# Patient Record
Sex: Female | Born: 2000 | Race: White | Hispanic: No | Smoking: Former smoker
Health system: Southern US, Community
[De-identification: ages and names within clinical notes are randomized; demographics above are authoritative.]

## PROBLEM LIST (undated history)

## (undated) DIAGNOSIS — J45909 Unspecified asthma, uncomplicated: Secondary | ICD-10-CM

## (undated) HISTORY — PX: OTHER SURGICAL HISTORY: SHX169

## (undated) HISTORY — PX: TYMPANOSTOMY TUBE PLACEMENT: SHX32

## (undated) HISTORY — PX: TONSILLECTOMY: SUR1361

---
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
## 2022-03-18 NOTE — Progress Notes (Signed)
BP 117/73 (BP Location: Right Arm, Cuff Size: Normal)   Pulse (!) 106   Temp 98.5 F (36.9 C) (Oral)   Ht 5' 0.8" (1.544 m)   Wt 187 lb 12.8 oz (85.2 kg)   SpO2 98%   BMI 35.72 kg/m    Subjective:    Patient ID: Veronica Ortega, female    DOB: March 07, 2001, 22 y.o.   MRN: [id number]  HPI: Veronica Ortega is a 22 y.o. female presenting on 03/19/2022 for comprehensive medical examination. Current medical complaints include:none  She currently lives with: Menopausal Symptoms: no   Denies HA, CP, SOB, dizziness, palpitations, visual changes, and lower extremity swelling.   Depression Screen done today and results listed below:     03/19/2022    9:34 AM 03/18/2021    9:20 AM 03/04/2021    3:48 PM 12/03/2020    3:07 PM  Depression screen PHQ 2/9  Decreased Interest 0 0 1 0  Down, Depressed, Hopeless 0 0 0 0  PHQ - 2 Score 0 0 1 0  Altered sleeping 0 0 1   Tired, decreased energy 0 0 0   Change in appetite 0 0 0   Feeling bad or failure about yourself  0 0 0   Trouble concentrating 0 0 0   Moving slowly or fidgety/restless 0 0 0   Suicidal thoughts 0 0 0   PHQ-9 Score 0 0 2   Difficult doing work/chores Not difficult at all Not difficult at all Not difficult at all     The patient does not have a history of falls. I did complete a risk assessment for falls. A plan of care for falls was documented.   Past Medical History:  Past Medical History:  Diagnosis Date   Asthma     Surgical History:  Past Surgical History:  Procedure Laterality Date   Addenoids Removed     TONSILLECTOMY     TYMPANOSTOMY TUBE PLACEMENT      Medications:  Current Outpatient Medications on File Prior to Visit  Medication Sig   albuterol (VENTOLIN HFA) 108 (90 Base) MCG/ACT inhaler Inhale 2 puffs into the lungs every 6 (six) hours as needed for wheezing or shortness of breath.   etonogestrel (NEXPLANON) 68 MG IMPL implant 1 each by Subdermal route once.   No current facility-administered  medications on file prior to visit.    Allergies:  No Known Allergies  Social History:  Social History   Socioeconomic History   Marital status: Single    Spouse name: Not on file   Number of children: Not on file   Years of education: Not on file   Highest education level: Not on file  Occupational History   Not on file  Tobacco Use   Smoking status: Never   Smokeless tobacco: Never  Vaping Use   Vaping Use: Some days  Substance and Sexual Activity   Alcohol use: No   Drug use: Never   Sexual activity: Yes    Partners: Male    Birth control/protection: Implant  Other Topics Concern   Not on file  Social History Narrative   Not on file   Social Determinants of Health   Financial Resource Strain: Not on file  Food Insecurity: Not on file  Transportation Needs: Not on file  Physical Activity: Not on file  Stress: Not on file  Social Connections: Not on file  Intimate Partner Violence: Not At Risk (12/03/2020)   Humiliation, Afraid, Rape, and Kick questionnaire  Fear of Current or Ex-Partner: No    Emotionally Abused: No    Physically Abused: No    Sexually Abused: No   Social History   Tobacco Use  Smoking Status Never  Smokeless Tobacco Never   Social History   Substance and Sexual Activity  Alcohol Use No    Family History:  Family History  Problem Relation Age of Onset   Asthma Mother    Depression Mother    Asthma Maternal Grandmother    Diabetes Maternal Grandmother    Hypertension Maternal Grandmother     Past medical history, surgical history, medications, allergies, family history and social history reviewed with patient today and changes made to appropriate areas of the chart.   Review of Systems  Eyes:  Negative for blurred vision and double vision.  Respiratory:  Negative for shortness of breath.   Cardiovascular:  Negative for chest pain, palpitations and leg swelling.  Neurological:  Negative for dizziness and headaches.   All  other ROS negative except what is listed above and in the HPI.      Objective:    BP 117/73 (BP Location: Right Arm, Cuff Size: Normal)   Pulse (!) 106   Temp 98.5 F (36.9 C) (Oral)   Ht 5' 0.8" (1.544 m)   Wt 187 lb 12.8 oz (85.2 kg)   SpO2 98%   BMI 35.72 kg/m   Wt Readings from Last 3 Encounters:  03/19/22 187 lb 12.8 oz (85.2 kg) (96 %, Z= 1.74)*  03/18/21 155 lb 9.6 oz (70.6 kg) (86 %, Z= 1.08)*  03/04/21 152 lb 3.2 oz (69 kg) (84 %, Z= 0.99)*   * Growth percentiles are based on CDC (Girls, 2-20 Years) data.    Physical Exam Vitals and nursing note reviewed.  Constitutional:      General: She is awake. She is not in acute distress.    Appearance: She is well-developed. She is not ill-appearing.  HENT:     Head: Normocephalic and atraumatic.     Right Ear: Hearing, tympanic membrane, ear canal and external ear normal. No drainage.     Left Ear: Hearing, tympanic membrane, ear canal and external ear normal. No drainage.     Nose: Nose normal.     Right Sinus: No maxillary sinus tenderness or frontal sinus tenderness.     Left Sinus: No maxillary sinus tenderness or frontal sinus tenderness.     Mouth/Throat:     Mouth: Mucous membranes are moist.     Pharynx: Oropharynx is clear. Uvula midline. No pharyngeal swelling, oropharyngeal exudate or posterior oropharyngeal erythema.  Eyes:     General: Lids are normal.        Right eye: No discharge.        Left eye: No discharge.     Extraocular Movements: Extraocular movements intact.     Conjunctiva/sclera: Conjunctivae normal.     Pupils: Pupils are equal, round, and reactive to light.     Visual Fields: Right eye visual fields normal and left eye visual fields normal.  Neck:     Thyroid: No thyromegaly.     Vascular: No carotid bruit.     Trachea: Trachea normal.  Cardiovascular:     Rate and Rhythm: Normal rate and regular rhythm.     Heart sounds: Normal heart sounds. No murmur heard.    No gallop.  Pulmonary:      Effort: Pulmonary effort is normal. No accessory muscle usage or respiratory distress.  Breath sounds: Normal breath sounds.  Chest:  Breasts:    Right: Normal.     Left: Normal.  Abdominal:     General: Bowel sounds are normal.     Palpations: Abdomen is soft. There is no hepatomegaly or splenomegaly.     Tenderness: There is no abdominal tenderness.  Musculoskeletal:        General: Normal range of motion.     Cervical back: Normal range of motion and neck supple.     Right lower leg: No edema.     Left lower leg: No edema.  Lymphadenopathy:     Head:     Right side of head: No submental, submandibular, tonsillar, preauricular or posterior auricular adenopathy.     Left side of head: No submental, submandibular, tonsillar, preauricular or posterior auricular adenopathy.     Cervical: No cervical adenopathy.     Upper Body:     Right upper body: No supraclavicular, axillary or pectoral adenopathy.     Left upper body: No supraclavicular, axillary or pectoral adenopathy.  Skin:    General: Skin is warm and dry.     Capillary Refill: Capillary refill takes less than 2 seconds.     Findings: No rash.  Neurological:     Mental Status: She is alert and oriented to person, place, and time.     Gait: Gait is intact.     Deep Tendon Reflexes: Reflexes are normal and symmetric.     Reflex Scores:      Brachioradialis reflexes are 2+ on the right side and 2+ on the left side.      Patellar reflexes are 2+ on the right side and 2+ on the left side. Psychiatric:        Attention and Perception: Attention normal.        Mood and Affect: Mood normal.        Speech: Speech normal.        Behavior: Behavior normal. Behavior is cooperative.        Thought Content: Thought content normal.        Judgment: Judgment normal.     Results for orders placed or performed in visit on 03/18/21  Chlamydia/Gonococcus/Trichomonas, NAA([facility])   Specimen: Urine   UR  Result Value Ref Range    Chlamydia by NAA Negative Negative   Gonococcus by NAA Negative Negative   Trich vag by NAA Negative Negative  Microscopic Examination   Urine  Result Value Ref Range   WBC, UA 0-5 0 - 5 /hpf   RBC, Urine None seen 0 - 2 /hpf   Epithelial Cells (non renal) 0-10 0 - 10 /hpf   Mucus, UA Present (A) Not Estab.   Bacteria, UA Many (A) None seen/Few  CBC with Differential/Platelet  Result Value Ref Range   WBC 9.7 3.4 - 10.8 x10E3/uL   RBC 5.09 3.77 - 5.28 x10E6/uL   Hemoglobin 15.1 11.1 - 15.9 g/dL   Hematocrit 46.2 34.0 - 46.6 %   MCV 91 79 - 97 fL   MCH 29.7 26.6 - 33.0 pg   MCHC 32.7 31.5 - 35.7 g/dL   RDW 12.7 11.7 - 15.4 %   Platelets 326 150 - 450 x10E3/uL   Neutrophils 66 Not Estab. %   Lymphs 24 Not Estab. %   Monocytes 7 Not Estab. %   Eos 2 Not Estab. %   Basos 1 Not Estab. %   Neutrophils Absolute 6.4 1.4 - 7.0 x10E3/uL  Lymphocytes Absolute 2.3 0.7 - 3.1 x10E3/uL   Monocytes Absolute 0.7 0.1 - 0.9 x10E3/uL   EOS (ABSOLUTE) 0.2 0.0 - 0.4 x10E3/uL   Basophils Absolute 0.1 0.0 - 0.2 x10E3/uL   Immature Granulocytes 0 Not Estab. %   Immature Grans (Abs) 0.0 0.0 - 0.1 x10E3/uL  Comprehensive metabolic panel  Result Value Ref Range   Glucose 85 70 - 99 mg/dL   BUN 15 6 - 20 mg/dL   Creatinine, Ser 0.72 0.57 - 1.00 mg/dL   eGFR 124 >59 mL/min/1.73   BUN/Creatinine Ratio 21 9 - 23   Sodium 139 134 - 144 mmol/L   Potassium 4.3 3.5 - 5.2 mmol/L   Chloride 101 96 - 106 mmol/L   CO2 20 20 - 29 mmol/L   Calcium 9.7 8.7 - 10.2 mg/dL   Total Protein 6.9 6.0 - 8.5 g/dL   Albumin 4.5 3.9 - 5.0 g/dL   Globulin, Total 2.4 1.5 - 4.5 g/dL   Albumin/Globulin Ratio 1.9 1.2 - 2.2   Bilirubin Total 0.2 0.0 - 1.2 mg/dL   Alkaline Phosphatase 87 42 - 106 IU/L   AST 13 0 - 40 IU/L   ALT 13 0 - 32 IU/L  Lipid panel  Result Value Ref Range   Cholesterol, Total 179 (H) 100 - 169 mg/dL   Triglycerides 70 0 - 89 mg/dL   HDL 41 >39 mg/dL   VLDL Cholesterol Cal 13 5 - 40 mg/dL    LDL Chol Calc (NIH) 125 (H) 0 - 109 mg/dL   Chol/HDL Ratio 4.4 0.0 - 4.4 ratio  TSH  Result Value Ref Range   TSH 2.620 0.450 - 4.500 uIU/mL  Urinalysis, Routine w reflex microscopic  Result Value Ref Range   Specific Gravity, UA >1.030 (H) 1.005 - 1.030   pH, UA 6.0 5.0 - 7.5   Color, UA Yellow Yellow   Appearance Ur Clear Clear   Leukocytes,UA Trace (A) Negative   Protein,UA Negative Negative/Trace   Glucose, UA Negative Negative   Ketones, UA Negative Negative   RBC, UA Negative Negative   Bilirubin, UA Negative Negative   Urobilinogen, Ur 0.2 0.2 - 1.0 mg/dL   Nitrite, UA Negative Negative   Microscopic Examination See below:       Assessment & Plan:   Problem List Items Addressed This Visit   None Visit Diagnoses     Annual physical exam    -  Primary   Health maintenance reviewed during visit today.  Labs ordered.  Vaccines up to date.   Relevant Orders   CBC with Differential/Platelet   Comprehensive metabolic panel   Lipid panel   TSH   Urinalysis, Routine w reflex microscopic   HIV antibody (with reflex)   Screening for ischemic heart disease       Relevant Orders   Lipid panel   Screening for chlamydial disease       Relevant Orders   GC/Chlamydia Probe Amp   Encounter for hepatitis C screening test for low risk patient       Relevant Orders   Hepatitis C Antibody   Need for influenza vaccination       Relevant Orders   Flu Vaccine QUAD 46moIM (Fluarix, Fluzone & Alfiuria Quad PF) (Completed)        Follow up plan: Return in about 1 year (around 03/20/2023) for Physical and Fasting labs.   LABORATORY TESTING:  - Pap smear: not applicable  IMMUNIZATIONS:   - Tdap: Tetanus vaccination status  reviewed: last tetanus booster within 10 years. - Influenza: Up to date - Pneumovax: Not applicable - Prevnar: Not applicable - COVID: Not applicable - HPV: Up to date - Shingrix vaccine: Not applicable  SCREENING: -Mammogram: Not applicable  -  Colonoscopy: Not applicable  - Bone Density: Not applicable  -Hearing Test: Not applicable  -Spirometry: Not applicable   PATIENT COUNSELING:   Advised to take 1 mg of folate supplement per day if capable of pregnancy.   Sexuality: Discussed sexually transmitted diseases, partner selection, use of condoms, avoidance of unintended pregnancy  and contraceptive alternatives.   Advised to avoid cigarette smoking.  I discussed with the patient that most people either abstain from alcohol or drink within safe limits (<=14/week and <=4 drinks/occasion for males, <=7/weeks and <= 3 drinks/occasion for females) and that the risk for alcohol disorders and other health effects rises proportionally with the number of drinks per week and how often a drinker exceeds daily limits.  Discussed cessation/primary prevention of drug use and availability of treatment for abuse.   Diet: Encouraged to adjust caloric intake to maintain  or achieve ideal body weight, to reduce intake of dietary saturated fat and total fat, to limit sodium intake by avoiding high sodium foods and not adding table salt, and to maintain adequate dietary potassium and calcium preferably from fresh fruits, vegetables, and low-fat dairy products.    stressed the importance of regular exercise  Injury prevention: Discussed safety belts, safety helmets, smoke detector, smoking near bedding or upholstery.   Dental health: Discussed importance of regular tooth brushing, flossing, and dental visits.    NEXT PREVENTATIVE PHYSICAL DUE IN 1 YEAR. Return in about 1 year (around 03/20/2023) for Physical and Fasting labs.

## 2022-03-19 ENCOUNTER — Ambulatory Visit (INDEPENDENT_AMBULATORY_CARE_PROVIDER_SITE_OTHER): Payer: Medicaid Other | Admitting: Nurse Practitioner

## 2022-03-19 ENCOUNTER — Encounter: Payer: Self-pay | Admitting: Nurse Practitioner

## 2022-03-19 VITALS — BP 117/73 | HR 106 | Temp 98.5°F | Ht 60.8 in | Wt 187.8 lb

## 2022-03-19 DIAGNOSIS — Z23 Encounter for immunization: Secondary | ICD-10-CM

## 2022-03-19 DIAGNOSIS — R8290 Unspecified abnormal findings in urine: Secondary | ICD-10-CM

## 2022-03-19 DIAGNOSIS — Z118 Encounter for screening for other infectious and parasitic diseases: Secondary | ICD-10-CM

## 2022-03-19 DIAGNOSIS — Z1159 Encounter for screening for other viral diseases: Secondary | ICD-10-CM | POA: Diagnosis not present

## 2022-03-19 DIAGNOSIS — Z136 Encounter for screening for cardiovascular disorders: Secondary | ICD-10-CM | POA: Diagnosis not present

## 2022-03-19 DIAGNOSIS — Z0000 Encounter for general adult medical examination without abnormal findings: Secondary | ICD-10-CM | POA: Diagnosis not present

## 2022-03-19 LAB — URINALYSIS, ROUTINE W REFLEX MICROSCOPIC
Bilirubin, UA: NEGATIVE
Glucose, UA: NEGATIVE
Ketones, UA: NEGATIVE
Nitrite, UA: NEGATIVE
RBC, UA: NEGATIVE
Specific Gravity, UA: 1.025 (ref 1.005–1.030)
Urobilinogen, Ur: 0.2 mg/dL (ref 0.2–1.0)
pH, UA: 5.5 (ref 5.0–7.5)

## 2022-03-19 LAB — MICROSCOPIC EXAMINATION: Epithelial Cells (non renal): >10 /hpf — ABNORMAL HIGH (ref 0–10)

## 2022-03-19 NOTE — Addendum Note (Signed)
Addended by: Jon Billings on: 03/19/2022 11:38 AM   Modules accepted: Orders

## 2022-03-20 LAB — HIV ANTIBODY (ROUTINE TESTING W REFLEX): HIV Screen 4th Generation wRfx: NONREACTIVE

## 2022-03-20 LAB — CBC WITH DIFFERENTIAL/PLATELET
Basophils Absolute: 0.1 10*3/uL (ref 0.0–0.2)
Basos: 1 %
EOS (ABSOLUTE): 0.1 10*3/uL (ref 0.0–0.4)
Eos: 1 %
Hematocrit: 47.3 % — ABNORMAL HIGH (ref 34.0–46.6)
Hemoglobin: 15.6 g/dL (ref 11.1–15.9)
Immature Grans (Abs): 0 10*3/uL (ref 0.0–0.1)
Immature Granulocytes: 0 %
Lymphocytes Absolute: 2.4 10*3/uL (ref 0.7–3.1)
Lymphs: 22 %
MCH: 29.5 pg (ref 26.6–33.0)
MCHC: 33 g/dL (ref 31.5–35.7)
MCV: 90 fL (ref 79–97)
Monocytes Absolute: 0.8 10*3/uL (ref 0.1–0.9)
Monocytes: 8 %
Neutrophils Absolute: 7.5 10*3/uL — ABNORMAL HIGH (ref 1.4–7.0)
Neutrophils: 68 %
Platelets: 372 10*3/uL (ref 150–450)
RBC: 5.28 x10E6/uL (ref 3.77–5.28)
RDW: 12.5 % (ref 11.7–15.4)
WBC: 11 10*3/uL — ABNORMAL HIGH (ref 3.4–10.8)

## 2022-03-20 LAB — COMPREHENSIVE METABOLIC PANEL
ALT: 11 IU/L (ref 0–32)
AST: 16 IU/L (ref 0–40)
Albumin/Globulin Ratio: 1.7 (ref 1.2–2.2)
Albumin: 4.5 g/dL (ref 4.0–5.0)
Alkaline Phosphatase: 112 IU/L — ABNORMAL HIGH (ref 42–106)
BUN/Creatinine Ratio: 15 (ref 9–23)
BUN: 11 mg/dL (ref 6–20)
Bilirubin Total: 0.6 mg/dL (ref 0.0–1.2)
CO2: 22 mmol/L (ref 20–29)
Calcium: 10 mg/dL (ref 8.7–10.2)
Chloride: 100 mmol/L (ref 96–106)
Creatinine, Ser: 0.75 mg/dL (ref 0.57–1.00)
Globulin, Total: 2.7 g/dL (ref 1.5–4.5)
Glucose: 92 mg/dL (ref 70–99)
Potassium: 3.9 mmol/L (ref 3.5–5.2)
Sodium: 139 mmol/L (ref 134–144)
Total Protein: 7.2 g/dL (ref 6.0–8.5)
eGFR: 118 mL/min/{1.73_m2} (ref >59)

## 2022-03-20 LAB — LIPID PANEL
Chol/HDL Ratio: 5.8 ratio — ABNORMAL HIGH (ref 0.0–4.4)
Cholesterol, Total: 186 mg/dL — ABNORMAL HIGH (ref 100–169)
HDL: 32 mg/dL — ABNORMAL LOW (ref >39)
LDL Chol Calc (NIH): 124 mg/dL — ABNORMAL HIGH (ref 0–109)
Triglycerides: 168 mg/dL — ABNORMAL HIGH (ref 0–89)
VLDL Cholesterol Cal: 30 mg/dL (ref 5–40)

## 2022-03-20 LAB — HEPATITIS C ANTIBODY: Hep C Virus Ab: NONREACTIVE

## 2022-03-20 LAB — TSH: TSH: 4.55 u[IU]/mL — ABNORMAL HIGH (ref 0.450–4.500)

## 2022-03-22 LAB — URINE CULTURE: Organism ID, Bacteria: NO GROWTH

## 2022-03-22 LAB — GC/CHLAMYDIA PROBE AMP
Chlamydia trachomatis, NAA: NEGATIVE
Neisseria Gonorrhoeae by PCR: NEGATIVE

## 2022-03-22 NOTE — Progress Notes (Signed)
Please let patient know that her lab work looks good.  Her cholesterol is elevated from prior.  I recommend following a low fat diet and exercising 5x weekly for at least 30 minutes.  Thyroid labs are slightly abnormal but we will recheck these at your next visit.  No other concerns at this time.  Follow up as discussed.

## 2022-03-31 ENCOUNTER — Telehealth: Payer: Self-pay

## 2022-03-31 NOTE — Telephone Encounter (Signed)
Pt given lab results per notes of K. Holdsworth NP on 03/31/22. Pt verbalized understanding.

## 2022-04-01 ENCOUNTER — Ambulatory Visit
Admission: EM | Admit: 2022-04-01 | Discharge: 2022-04-01 | Disposition: A | Discharge status: Home / Self Care | Payer: Medicaid Other | Attending: Internal Medicine | Admitting: Internal Medicine

## 2022-04-01 DIAGNOSIS — L03032 Cellulitis of left toe: Secondary | ICD-10-CM

## 2022-04-01 DIAGNOSIS — L600 Ingrowing nail: Secondary | ICD-10-CM

## 2022-04-01 MED ORDER — CEPHALEXIN 500 MG PO CAPS: 500.0000 mg | ORAL_CAPSULE | Freq: Three times a day (TID) | ORAL | 0 refills | Status: AC

## 2022-04-01 MED ORDER — IBUPROFEN 400 MG PO TABS: 400.0000 mg | ORAL_TABLET | Freq: Four times a day (QID) | ORAL | 0 refills | Status: DC | PRN

## 2022-04-01 NOTE — ED Triage Notes (Signed)
Pt c/o LT big toe swelling & pain, pt reports toe looks infected, pt states she had this occur in December in same toe.

## 2022-04-01 NOTE — Discharge Instructions (Signed)
Warm salt water soaks Tylenol/Motrin as needed for pain and/or fever Take medications as prescribed Please apply topical antibiotic ointment to the nail fold until it is healed completely. If you have worsening symptoms please return to urgent care to be reevaluated

## 2022-04-03 NOTE — ED Provider Notes (Signed)
[facility]    CSN: [id number] Arrival date & time: 04/01/22  4098      History   Chief Complaint Chief Complaint  Patient presents with   Toe Pain    LT big toe    HPI Veronica Ortega is a 22 y.o. female comes to urgent care with worsening pain in the left great toe.  Patient says she had a similar episode a few weeks ago involving the medial nail fold of the left great toe.  That subsided without any intervention.  Over the past few days she has been experiencing worsening pain, swelling, purulent discharge and redness of the lateral nail fold of the left great toe.  She denies any trauma to the left foot or toe.  No fever or chills.Marland Kitchen   HPI  Past Medical History:  Diagnosis Date   Asthma     Patient Active Problem List   Diagnosis Date Noted   Asthma    Depression, major, single episode, complete remission ([place])    Seasonal allergic rhinitis due to pollen 01/03/2015    Past Surgical History:  Procedure Laterality Date   Addenoids Removed     TONSILLECTOMY     TYMPANOSTOMY TUBE PLACEMENT      OB History     Gravida  0   Para  0   Term  0   Preterm  0   AB  0   Living  0      SAB  0   IAB  0   Ectopic  0   Multiple  0   Live Births  0            Home Medications    Prior to Admission medications   Medication Sig Start Date End Date Taking? Authorizing Provider  cephALEXin (KEFLEX) 500 MG capsule Take 1 capsule (500 mg total) by mouth 3 (three) times daily for 5 days. 04/01/22 04/06/22 Yes Jamyah Folk, Myrene Galas, MD  ibuprofen (ADVIL) 400 MG tablet Take 1 tablet (400 mg total) by mouth every 6 (six) hours as needed. 04/01/22  Yes Yolanda Huffstetler, Myrene Galas, MD  albuterol (VENTOLIN HFA) 108 (90 Base) MCG/ACT inhaler Inhale 2 puffs into the lungs every 6 (six) hours as needed for wheezing or shortness of breath.    [provider]  etonogestrel (NEXPLANON) 68 MG IMPL implant 1 each by Subdermal route once.    [provider]     Family History Family History  Problem Relation Age of Onset   Asthma Mother    Depression Mother    Asthma Maternal Grandmother    Diabetes Maternal Grandmother    Hypertension Maternal Grandmother     Social History Social History   Tobacco Use   Smoking status: Never   Smokeless tobacco: Never  Vaping Use   Vaping Use: Some days  Substance Use Topics   Alcohol use: No   Drug use: Never     Allergies   Patient has no known allergies.   Review of Systems Review of Systems  Constitutional: Negative.   Respiratory: Negative.    Genitourinary: Negative.   Musculoskeletal: Negative.   Skin:  Positive for color change and wound.     Physical Exam Triage Vital Signs ED Triage Vitals  Enc Vitals Group     BP 04/01/22 0938 123/80     Pulse Rate 04/01/22 0938 80     Resp --      Temp 04/01/22 0938 (!) 97.3 F (36.3 C)  Temp Source 04/01/22 0938 Oral     SpO2 04/01/22 0938 99 %     Weight 04/01/22 0936 187 lb 12.8 oz (85.2 kg)     Height 04/01/22 0936 5' 0.79" (1.544 m)     Head Circumference --      Peak Flow --      Pain Score 04/01/22 0936 7     Pain Loc --      Pain Edu? --      Excl. in GC? --    No data found.  Updated Vital Signs BP 123/80 (BP Location: Right Arm)   Pulse 80   Temp (!) 97.3 F (36.3 C) (Oral)   Ht 5' 0.79" (1.544 m)   Wt 85.2 kg   SpO2 99%   BMI 35.73 kg/m   Visual Acuity Right Eye Distance:   Left Eye Distance:   Bilateral Distance:    Right Eye Near:   Left Eye Near:    Bilateral Near:     Physical Exam Vitals and nursing note reviewed.  Constitutional:      Appearance: Normal appearance.  Cardiovascular:     Rate and Rhythm: Normal rate and regular rhythm.     Pulses: Normal pulses.     Heart sounds: Normal heart sounds.  Pulmonary:     Effort: Pulmonary effort is normal.     Breath sounds: Normal breath sounds.  Musculoskeletal:        General: Normal range of motion.  Skin:    Findings: Erythema  present.     Comments: Tender swelling of the lateral nail fold of the left great toe.  Swelling is erythematous.  Nail of the left great toe is intact.  Neurological:     Mental Status: She is alert.      UC Treatments / Results  Labs (all labs ordered are listed, but only abnormal results are displayed) Labs Reviewed - No data to display  EKG   Radiology No results found.  Procedures Excise Ingrown Toenail  Date/Time: 04/03/2022 11:23 PM  Performed by: Merrilee Jansky, MD Authorized by: Merrilee Jansky, MD   Consent:    Consent obtained:  Verbal   Consent given by:  Patient   Risks discussed:  Bleeding and incomplete removal   Alternatives discussed:  No treatment Universal protocol:    Patient identity confirmed:  Verbally with patient Location:    Foot:  L big toe Pre-procedure details:    Skin preparation:  Povidone-iodine   Preparation: Patient was prepped and draped in the usual sterile fashion   Anesthesia:    Anesthesia method:  Local infiltration   Local anesthetic:  Lidocaine 1% w/o epi Nail Removal:    Nail removed:  Complete   Nail bed repaired: no     Removed nail replaced and anchored: no   Trephination:    Subungual hematoma drained: no   Ingrown nail:    Wedge excision of skin: no     Nail matrix removed or ablated:  None Post-procedure details:    Dressing:  Non-adhesive packing strip and antibiotic ointment   Procedure completion:  Tolerated well, no immediate complications  (including critical care time)  Medications Ordered in UC Medications - No data to display  Initial Impression / Assessment and Plan / UC Course  I have reviewed the triage vital signs and the nursing notes.  Pertinent labs & imaging results that were available during my care of the patient were reviewed by me and considered  in my medical decision making (see chart for details).     1.  Ingrown toenail left great toe: Keflex 500 mg 3 times daily for 5  days Ibuprofen every 6-8 hours as needed for pain Warm salt water soaks Return precautions given. Final Clinical Impressions(s) / UC Diagnoses   Final diagnoses:  Paronychia of toe of left foot due to ingrown toenail     Discharge Instructions      Warm salt water soaks Tylenol/Motrin as needed for pain and/or fever Take medications as prescribed Please apply topical antibiotic ointment to the nail fold until it is healed completely. If you have worsening symptoms please return to urgent care to be reevaluated   ED Prescriptions     Medication Sig Dispense Auth. Provider   cephALEXin (KEFLEX) 500 MG capsule Take 1 capsule (500 mg total) by mouth 3 (three) times daily for 5 days. 15 capsule Nakeesha Bowler, Myrene Galas, MD   ibuprofen (ADVIL) 400 MG tablet Take 1 tablet (400 mg total) by mouth every 6 (six) hours as needed. 30 tablet Eryck Negron, Myrene Galas, MD      PDMP not reviewed this encounter.   Chase Picket, MD 04/03/22 2325

## 2022-05-14 DIAGNOSIS — H5213 Myopia, bilateral: Secondary | ICD-10-CM | POA: Diagnosis not present

## 2022-05-24 ENCOUNTER — Ambulatory Visit
Admission: EM | Admit: 2022-05-24 | Discharge: 2022-05-24 | Disposition: A | Discharge status: Home / Self Care | Payer: Medicaid Other | Attending: Physician Assistant | Admitting: Physician Assistant

## 2022-05-24 ENCOUNTER — Encounter: Payer: Self-pay | Admitting: Emergency Medicine

## 2022-05-24 DIAGNOSIS — Z3202 Encounter for pregnancy test, result negative: Secondary | ICD-10-CM

## 2022-05-24 LAB — PREGNANCY, URINE: Preg Test, Ur: NEGATIVE

## 2022-05-24 NOTE — ED Provider Notes (Signed)
[facility]    CSN: [id number] Arrival date & time: 05/24/22  1556      History   Chief Complaint Chief Complaint  Patient presents with   Possible Pregnancy    HPI Veronica Ortega is a 22 y.o. female presenting for concerns about possible pregnancy.  Patient reports that she has had Nexplanon for the past 2 years.  States she can still feel it in her arm.  Says over the past few days she started to have headaches, mood swings and breast tenderness.  She reports that her last menstrual period was in 2021 and she has not had a menstrual period since being on birth control.  She is sexually active.  No other complaints or concerns.  HPI  Past Medical History:  Diagnosis Date   Asthma     Patient Active Problem List   Diagnosis Date Noted   Asthma    Depression, major, single episode, complete remission ([place])    Seasonal allergic rhinitis due to pollen 01/03/2015    Past Surgical History:  Procedure Laterality Date   Addenoids Removed     TONSILLECTOMY     TYMPANOSTOMY TUBE PLACEMENT      OB History     Gravida  0   Para  0   Term  0   Preterm  0   AB  0   Living  0      SAB  0   IAB  0   Ectopic  0   Multiple  0   Live Births  0            Home Medications    Prior to Admission medications   Medication Sig Start Date End Date Taking? Authorizing Provider  albuterol (VENTOLIN HFA) 108 (90 Base) MCG/ACT inhaler Inhale 2 puffs into the lungs every 6 (six) hours as needed for wheezing or shortness of breath.    [provider]  etonogestrel (NEXPLANON) 68 MG IMPL implant 1 each by Subdermal route once.    [provider]  ibuprofen (ADVIL) 400 MG tablet Take 1 tablet (400 mg total) by mouth every 6 (six) hours as needed. 04/01/22   Lamptey, Myrene Galas, MD    Family History Family History  Problem Relation Age of Onset   Asthma Mother    Depression Mother    Asthma Maternal Grandmother    Diabetes Maternal  Grandmother    Hypertension Maternal Grandmother     Social History Social History   Tobacco Use   Smoking status: Every Day    Types: Cigarettes   Smokeless tobacco: Never  Vaping Use   Vaping Use: Former  Substance Use Topics   Alcohol use: No   Drug use: Never     Allergies   Patient has no known allergies.   Review of Systems Review of Systems  Constitutional:  Negative for fatigue and fever.  Respiratory:  Negative for shortness of breath.   Cardiovascular:  Negative for chest pain.  Gastrointestinal:  Negative for abdominal pain, nausea and vomiting.  Genitourinary:  Negative for vaginal bleeding.  Skin:        +breast tenderness  Neurological:  Positive for headaches. Negative for dizziness and weakness.  Psychiatric/Behavioral:         +mood swings     Physical Exam Triage Vital Signs ED Triage Vitals  Enc Vitals Group     BP 05/24/22 1645 135/85     Pulse Rate 05/24/22 1645 (!) 126  Resp 05/24/22 1645 18     Temp 05/24/22 1645 97.9 F (36.6 C)     Temp Source 05/24/22 1645 Oral     SpO2 05/24/22 1645 96 %     Weight --      Height --      Head Circumference --      Peak Flow --      Pain Score 05/24/22 1644 0     Pain Loc --      Pain Edu? --      Excl. in [place]? --    No data found.  Updated Vital Signs BP 135/85 (BP Location: Right Arm)   Pulse (!) 126   Temp 97.9 F (36.6 C) (Oral)   Resp 18   SpO2 96%      Physical Exam Vitals and nursing note reviewed.  Constitutional:      General: She is not in acute distress.    Appearance: Normal appearance. She is not ill-appearing or toxic-appearing.  HENT:     Head: Normocephalic and atraumatic.     Nose: Nose normal.     Mouth/Throat:     Mouth: Mucous membranes are moist.     Pharynx: Oropharynx is clear.  Eyes:     General: No scleral icterus.       Right eye: No discharge.        Left eye: No discharge.     Conjunctiva/sclera: Conjunctivae normal.  Cardiovascular:     Rate  and Rhythm: Regular rhythm. Tachycardia present.     Heart sounds: Normal heart sounds.  Pulmonary:     Effort: Pulmonary effort is normal. No respiratory distress.     Breath sounds: Normal breath sounds.  Musculoskeletal:     Cervical back: Neck supple.  Skin:    General: Skin is dry.  Neurological:     General: No focal deficit present.     Mental Status: She is alert. Mental status is at baseline.     Motor: No weakness.     Gait: Gait normal.  Psychiatric:        Mood and Affect: Mood normal.        Behavior: Behavior normal.        Thought Content: Thought content normal.      UC Treatments / Results  Labs (all labs ordered are listed, but only abnormal results are displayed) Labs Reviewed  PREGNANCY, URINE    EKG   Radiology No results found.  Procedures Procedures (including critical care time)  Medications Ordered in UC Medications - No data to display  Initial Impression / Assessment and Plan / UC Course  I have reviewed the triage vital signs and the nursing notes.  Pertinent labs & imaging results that were available during my care of the patient were reviewed by me and considered in my medical decision making (see chart for details).   22 year old female presents for headaches, mood swings and breast tenderness for the past few days.  Would like a pregnancy test.  Has had a Nexplanon for the past couple of years and can still feel it in her arm.  She is sexually active.  She has not had a menstrual period since before she had the Nexplanon.  No other complaints or symptoms.  Pregnancy test negative.  Discussed with patient.  Advised her that her symptoms are likely related to the birth control and hormonal symptoms.  Suggested rest, fluids, Midol or ibuprofen for discomfort.  Reviewed return precautions.  Final Clinical Impressions(s) / UC Diagnoses   Final diagnoses:  Encounter for pregnancy test with result negative   Discharge Instructions    None    ED Prescriptions   None    PDMP not reviewed this encounter.   Danton Clap, PA-C 05/24/22 1708

## 2022-05-24 NOTE — ED Triage Notes (Signed)
Pt presents for pregnancy test. She reports having a nexplanon. She is having headaches, mood swings and breast tenderness.

## 2022-08-10 ENCOUNTER — Ambulatory Visit
Admission: EM | Admit: 2022-08-10 | Discharge: 2022-08-10 | Disposition: A | Discharge status: Home / Self Care | Payer: Medicaid Other | Attending: Emergency Medicine | Admitting: Emergency Medicine

## 2022-08-10 DIAGNOSIS — N61 Mastitis without abscess: Secondary | ICD-10-CM | POA: Diagnosis not present

## 2022-08-10 MED ORDER — IBUPROFEN 600 MG PO TABS: 600.0000 mg | ORAL_TABLET | Freq: Four times a day (QID) | ORAL | 0 refills | Status: DC | PRN

## 2022-08-10 MED ORDER — DOXYCYCLINE HYCLATE 100 MG PO CAPS: 100.0000 mg | ORAL_CAPSULE | Freq: Two times a day (BID) | ORAL | 0 refills | Status: AC

## 2022-08-10 NOTE — ED Provider Notes (Signed)
HPI  SUBJECTIVE:  Veronica Ortega is a 22 y.o. female who presents with 4 days of a painful area of erythema with gradually increasing size along her lateral left breast.  She describes the pain as stinging, present only with palpation.  She reports localized swelling.  No known insect bite, trauma to the area, drainage from the area.  No nipple drainage.  No wedge-shaped area of erythema, edema.  No fevers, body aches.  No lesions elsewhere.  No antipyretic in the past 6 hours.  No antibiotics in the past month.  She tried Tylenol and ibuprofen without improvement in her symptoms.  Symptoms are worse with palpation.  She has a past medical history of diabetes, MRSA.  LMP: Mid April.  Denies the possibility of being pregnant.  States we do not need to check.  PCP: Cannot remember.   Past Medical History:  Diagnosis Date   Asthma     Past Surgical History:  Procedure Laterality Date   Addenoids Removed     TONSILLECTOMY     TYMPANOSTOMY TUBE PLACEMENT      Family History  Problem Relation Age of Onset   Asthma Mother    Depression Mother    Asthma Maternal Grandmother    Diabetes Maternal Grandmother    Hypertension Maternal Grandmother     Social History   Tobacco Use   Smoking status: Former    Types: Cigarettes   Smokeless tobacco: Never  Vaping Use   Vaping Use: Former  Substance Use Topics   Alcohol use: No   Drug use: Yes    Types: Marijuana    No current facility-administered medications for this encounter.  Current Outpatient Medications:    albuterol (VENTOLIN HFA) 108 (90 Base) MCG/ACT inhaler, Inhale 2 puffs into the lungs every 6 (six) hours as needed for wheezing or shortness of breath., Disp: , Rfl:    doxycycline (VIBRAMYCIN) 100 MG capsule, Take 1 capsule (100 mg total) by mouth 2 (two) times daily for 7 days., Disp: 14 capsule, Rfl: 0   etonogestrel (NEXPLANON) 68 MG IMPL implant, 1 each by Subdermal route once., Disp: , Rfl:    ibuprofen (ADVIL) 600 MG  tablet, Take 1 tablet (600 mg total) by mouth every 6 (six) hours as needed., Disp: 30 tablet, Rfl: 0  No Known Allergies   ROS  As noted in HPI.   Physical Exam  BP 138/84 (BP Location: Left Arm)   Pulse 84   Temp 97.7 F (36.5 C) (Oral)   Wt 84.4 kg   LMP 06/28/2022   SpO2 97%   BMI 35.39 kg/m   Constitutional: Well developed, well nourished, no acute distress Eyes:  EOMI, conjunctiva normal bilaterally HENT: Normocephalic, atraumatic,mucus membranes moist Respiratory: Normal inspiratory effort Cardiovascular: Normal rate GI: nondistended skin: 2.5 x 2.5 cm tender area of erythema in the 3 o'clock position lateral left breast.  No wedge.  No expressible purulent drainage.  No induration.  Marked area with a marker for reference.   Musculoskeletal: no deformities Neurologic: Alert & oriented x 3, no focal neuro deficits Psychiatric: Speech and behavior appropriate   ED Course   Medications - No data to display  No orders of the defined types were placed in this encounter.   No results found for this or any previous visit (from the past 24 hour(s)). No results found.  ED Clinical Impression  1. Cellulitis of left breast      ED Assessment/Plan     Presentation consistent with  a left breast cellulitis.  There does not appear to be anything to drain today.  Home with doxycycline for a week.  Tylenol/ibuprofen, warm compresses.  Follow-up with primary care as needed.  ER return precautions given.  Discussed MDM, treatment plan, and plan for follow-up with patient. Discussed sn/sx that should prompt return to the ED. patient agrees with plan.   Meds ordered this encounter  Medications   doxycycline (VIBRAMYCIN) 100 MG capsule    Sig: Take 1 capsule (100 mg total) by mouth 2 (two) times daily for 7 days.    Dispense:  14 capsule    Refill:  0   ibuprofen (ADVIL) 600 MG tablet    Sig: Take 1 tablet (600 mg total) by mouth every 6 (six) hours as needed.     Dispense:  30 tablet    Refill:  0      *This clinic note was created using Scientist, clinical (histocompatibility and immunogenetics). Therefore, there may be occasional mistakes despite careful proofreading.  ?    Domenick Gong, MD 08/10/22 [name]

## 2022-08-10 NOTE — Discharge Instructions (Signed)
You have a small infection of your breast.  Finish the doxycycline, even if you feel better.  You may take the ibuprofen with 1000 mg of Tylenol 3 times a day as needed for pain.  Warm compresses to the area as often as you can.

## 2022-08-10 NOTE — ED Triage Notes (Signed)
Pt c/o sore on her chest x4days  Pt states that the sore has gotten larger and more red.   Pt states that the sore is along the lateral side of the left breast.   Pt denies any drainage.  Pt states that the spot is round, red, raised, and the size of a quarter.

## 2022-08-15 ENCOUNTER — Ambulatory Visit
Admission: EM | Admit: 2022-08-15 | Discharge: 2022-08-15 | Payer: Medicaid Other | Attending: Family Medicine | Admitting: Family Medicine

## 2022-08-15 ENCOUNTER — Encounter: Payer: Self-pay | Admitting: Emergency Medicine

## 2022-08-15 DIAGNOSIS — Z79899 Other long term (current) drug therapy: Secondary | ICD-10-CM | POA: Diagnosis not present

## 2022-08-15 DIAGNOSIS — N644 Mastodynia: Secondary | ICD-10-CM | POA: Diagnosis not present

## 2022-08-15 DIAGNOSIS — L03818 Cellulitis of other sites: Secondary | ICD-10-CM | POA: Diagnosis not present

## 2022-08-15 DIAGNOSIS — N61 Mastitis without abscess: Secondary | ICD-10-CM | POA: Diagnosis not present

## 2022-08-15 NOTE — Discharge Instructions (Addendum)
Your breast infection is not getting better with antibiotics by mouth.  You may have an abscess that needs to be drained or need IV antibiotics.  You have an elevated temperature and your heart rate is fast which may indicate a widespread infection.   You have been advised to follow up immediately in the emergency department for concerning signs or symptoms as discussed during your visit. If you declined EMS transport, please have a family member take you directly to the ED at this time. Do not delay.   Based on concerns about condition, if you do not follow up in the ED, you may risk poor outcomes including worsening of condition, delayed treatment and potentially life threatening issues. If you have declined to go to the ED at this time, you should call your PCP immediately to set up a follow up appointment.   Go to ED for red flag symptoms, including; fevers you cannot reduce with Tylenol/Motrin, severe headaches, vision changes, numbness/weakness in part of the body, lethargy, confusion, intractable vomiting, severe dehydration, chest pain, breathing difficulty, severe persistent abdominal or pelvic pain, signs of severe infection (increased redness, swelling of an area), feeling faint or passing out, dizziness, etc. You should especially go to the ED for sudden acute worsening of condition if you do not elect to go at this time.

## 2022-08-15 NOTE — ED Notes (Addendum)
Patient is being discharged from the Urgent Care and sent to the Emergency Department via POV . Per Dr.Brimage, patient is in need of higher level of care due to cellulitis in breast. Patient is aware and verbalizes understanding of plan of care.  Vitals:   08/15/22 1452  BP: 138/83  Pulse: (!) 127  Resp: 14  Temp: 99.2 F (37.3 C)  SpO2: 96%

## 2022-08-15 NOTE — ED Provider Notes (Signed)
[facility]    CSN: [id number] Arrival date & time: 08/15/22  1442      History   Chief Complaint Chief Complaint  Patient presents with   Breast Pain    Left     HPI Veronica Ortega is a 22 y.o. female.   HPI  Veronica Ortega presents for left breast pain.  She was seen and given doxycycline which she has been taking. Has been applying warm compresses on it but the pain and redness is not getting better. She is starting to have fevers and drainage. No vomiting, headache or chills.  Taking OTC pain medications with minimal relief.      Past Medical History:  Diagnosis Date   Asthma     Patient Active Problem List   Diagnosis Date Noted   Asthma    Depression, major, single episode, complete remission ([facility])    Seasonal allergic rhinitis due to pollen 01/03/2015    Past Surgical History:  Procedure Laterality Date   Addenoids Removed     TONSILLECTOMY     TYMPANOSTOMY TUBE PLACEMENT      OB History     Gravida  0   Para  0   Term  0   Preterm  0   AB  0   Living  0      SAB  0   IAB  0   Ectopic  0   Multiple  0   Live Births  0            Home Medications    Prior to Admission medications   Medication Sig Start Date End Date Taking? Authorizing Provider  albuterol (VENTOLIN HFA) 108 (90 Base) MCG/ACT inhaler Inhale 2 puffs into the lungs every 6 (six) hours as needed for wheezing or shortness of breath.    [provider]  etonogestrel (NEXPLANON) 68 MG IMPL implant 1 each by Subdermal route once.    [provider]  ibuprofen (ADVIL) 600 MG tablet Take 1 tablet (600 mg total) by mouth every 6 (six) hours as needed. 08/10/22   Domenick Gong, MD    Family History Family History  Problem Relation Age of Onset   Asthma Mother    Depression Mother    Asthma Maternal Grandmother    Diabetes Maternal Grandmother    Hypertension Maternal Grandmother     Social History Social History   Tobacco Use    Smoking status: Former    Types: Cigarettes   Smokeless tobacco: Never  Vaping Use   Vaping Use: Former  Substance Use Topics   Alcohol use: No   Drug use: Yes    Types: Marijuana     Allergies   Patient has no known allergies.   Review of Systems Review of Systems :negative unless otherwise stated in HPI.      Physical Exam Triage Vital Signs ED Triage Vitals  Enc Vitals Group     BP 08/15/22 1452 138/83     Pulse Rate 08/15/22 1452 (!) 127     Resp 08/15/22 1452 14     Temp 08/15/22 1452 99.2 F (37.3 C)     Temp Source 08/15/22 1452 Oral     SpO2 08/15/22 1452 96 %     Weight 08/15/22 1450 186 lb 1.1 oz (84.4 kg)     Height 08/15/22 1450 5' (1.524 m)     Head Circumference --      Peak Flow --      Pain  Score 08/15/22 1450 4     Pain Loc --      Pain Edu? --      Excl. in [place]? --    No data found.  Updated Vital Signs BP 138/83 (BP Location: Right Arm)   Pulse (!) 127   Temp 99.2 F (37.3 C) (Oral)   Resp 14   Ht 5' (1.524 m)   Wt 84.4 kg   LMP 06/28/2022   SpO2 96%   BMI 36.34 kg/m   Visual Acuity Right Eye Distance:   Left Eye Distance:   Bilateral Distance:    Right Eye Near:   Left Eye Near:    Bilateral Near:     Physical Exam  GEN: alert, well appearing female, in no acute distress  EYES: extra occular movements intact, no scleral injection CV: regular rhythm, tachycardic  RESP: no increased work of breathing, clear to ascultation bilaterally MSK: no LE extremity edema, normal ROM of bilateral shoulders  NEURO: alert, moves all extremities appropriately, normal gait PSYCH: Normal affect, appropriate speech and behavior  SKIN: warm and dry; 4 cm area of erythema with purulent drainage from central desquamated area, no fluctuance     UC Treatments / Results  Labs (all labs ordered are listed, but only abnormal results are displayed) Labs Reviewed - No data to display  EKG   Radiology No results  found.  Procedures Procedures (including critical care time)  Medications Ordered in UC Medications - No data to display  Initial Impression / Assessment and Plan / UC Course  I have reviewed the triage vital signs and the nursing notes.  Pertinent labs & imaging results that were available during my care of the patient were reviewed by me and considered in my medical decision making (see chart for details).     Patient is a 22 y.o. femalewho presents for left breast pain.  Overall, patient is non-toxic-appearing and well-hydrated.  Vital signs stable.  Veronica Ortega is tachycardic and has an elevated temperature.   Exam concerning for possible abscess. She has seemingly failed outpatient MRSA treatment for cellulitis and may need IV antibiotics. Recommended ED evaluation for ultrasound of her breast and consideration for IV treatment.  Pt to travel to [facility] via private vehicle.   Reviewed expectations regarding course of current medical issues.  All questions asked were answered.  Outlined signs and symptoms indicating need for more acute intervention. Patient verbalized understanding. After Visit Summary given.   Final Clinical Impressions(s) / UC Diagnoses   Final diagnoses:  Cellulitis of other specified site  Breast pain, left     Discharge Instructions      Your breast infection is not getting better with antibiotics by mouth.  You may have an abscess that needs to be drained or need IV antibiotics.  You have an elevated temperature and your heart rate is fast which may indicate a widespread infection.   You have been advised to follow up immediately in the emergency department for concerning signs or symptoms as discussed during your visit. If you declined EMS transport, please have a family member take you directly to the ED at this time. Do not delay.   Based on concerns about condition, if you do not follow up in the ED, you may risk poor outcomes including worsening of  condition, delayed treatment and potentially life threatening issues. If you have declined to go to the ED at this time, you should call your PCP immediately to set up a  follow up appointment.   Go to ED for red flag symptoms, including; fevers you cannot reduce with Tylenol/Motrin, severe headaches, vision changes, numbness/weakness in part of the body, lethargy, confusion, intractable vomiting, severe dehydration, chest pain, breathing difficulty, severe persistent abdominal or pelvic pain, signs of severe infection (increased redness, swelling of an area), feeling faint or passing out, dizziness, etc. You should especially go to the ED for sudden acute worsening of condition if you do not elect to go at this time.       ED Prescriptions   None    PDMP not reviewed this encounter.              Katha Cabal, DO 08/19/22 [phone]

## 2022-08-15 NOTE — ED Triage Notes (Signed)
Patient was seen here on 08/10/22 for left breast infection.  Patient states that the area has gotten better and has spread more.  Patient reports throbbing soreness in her left breast. Patient denies fevers

## 2022-08-19 ENCOUNTER — Ambulatory Visit (INDEPENDENT_AMBULATORY_CARE_PROVIDER_SITE_OTHER): Payer: Medicaid Other | Admitting: Nurse Practitioner

## 2022-08-19 ENCOUNTER — Encounter: Payer: Self-pay | Admitting: Nurse Practitioner

## 2022-08-19 VITALS — BP 137/78 | HR 93 | Temp 98.6°F | Wt 203.6 lb

## 2022-08-19 DIAGNOSIS — N61 Mastitis without abscess: Secondary | ICD-10-CM | POA: Diagnosis not present

## 2022-08-19 NOTE — Progress Notes (Signed)
BP 137/78   Pulse 93   Temp 98.6 F (37 C)   Wt 203 lb 9.6 oz (92.4 kg)   LMP 06/28/2022   SpO2 99%   BMI 39.76 kg/m    Subjective:    Patient ID: Veronica Ortega, female    DOB: Sep 30, 2000, 22 y.o.   MRN: [id number]  HPI: Veronica Ortega is a 22 y.o. female  Chief Complaint  Patient presents with   Cyst    On left breast noticed it maybe a week ago.   Patient states she has a cyst on her left breast that started about a week ago.  Patient states it is only painful when she gets out of the shower.  She went to the ER and was given Bactrim but didn't start taking it till yesterday.  States the swelling went down.  She has not had discharge since starting the bactrim.  Denies fever or discharge.   Relevant past medical, surgical, family and social history reviewed and updated as indicated. Interim medical history since our last visit reviewed. Allergies and medications reviewed and updated.  Review of Systems  Skin:        Cyst on left breast    Per HPI unless specifically indicated above     Objective:    BP 137/78   Pulse 93   Temp 98.6 F (37 C)   Wt 203 lb 9.6 oz (92.4 kg)   LMP 06/28/2022   SpO2 99%   BMI 39.76 kg/m   Wt Readings from Last 3 Encounters:  08/19/22 203 lb 9.6 oz (92.4 kg)  08/15/22 186 lb 1.1 oz (84.4 kg)  08/10/22 186 lb (84.4 kg)    Physical Exam Vitals and nursing note reviewed.  Constitutional:      General: She is not in acute distress.    Appearance: Normal appearance. She is normal weight. She is not ill-appearing, toxic-appearing or diaphoretic.  HENT:     Head: Normocephalic.     Right Ear: External ear normal.     Left Ear: External ear normal.     Nose: Nose normal.     Mouth/Throat:     Mouth: Mucous membranes are moist.     Pharynx: Oropharynx is clear.  Eyes:     General:        Right eye: No discharge.        Left eye: No discharge.     Extraocular Movements: Extraocular movements intact.     Conjunctiva/sclera:  Conjunctivae normal.     Pupils: Pupils are equal, round, and reactive to light.  Cardiovascular:     Rate and Rhythm: Normal rate and regular rhythm.     Heart sounds: No murmur heard. Pulmonary:     Effort: Pulmonary effort is normal. No respiratory distress.     Breath sounds: Normal breath sounds. No wheezing or rales.  Musculoskeletal:     Cervical back: Normal range of motion and neck supple.  Skin:    General: Skin is warm and dry.     Capillary Refill: Capillary refill takes less than 2 seconds.       Neurological:     General: No focal deficit present.     Mental Status: She is alert and oriented to person, place, and time. Mental status is at baseline.  Psychiatric:        Mood and Affect: Mood normal.        Behavior: Behavior normal.        Thought  Content: Thought content normal.        Judgment: Judgment normal.     Results for orders placed or performed during the hospital encounter of 05/24/22  Pregnancy, urine  Result Value Ref Range   Preg Test, Ur NEGATIVE NEGATIVE      Assessment & Plan:   Problem List Items Addressed This Visit   None Visit Diagnoses     Cellulitis of left breast    -  Primary   Improved. Continue with warm compress, PRN ibuprofen and complete full course of bactrim.  Follow up if not improved.        Follow up plan: No follow-ups on file.

## 2022-09-23 ENCOUNTER — Ambulatory Visit (INDEPENDENT_AMBULATORY_CARE_PROVIDER_SITE_OTHER): Payer: Medicaid Other | Admitting: Physician Assistant

## 2022-09-23 ENCOUNTER — Encounter: Payer: Self-pay | Admitting: Physician Assistant

## 2022-09-23 VITALS — BP 104/69 | HR 93 | Ht 60.0 in | Wt 199.6 lb

## 2022-09-23 DIAGNOSIS — Z975 Presence of (intrauterine) contraceptive device: Secondary | ICD-10-CM | POA: Diagnosis not present

## 2022-09-23 DIAGNOSIS — N611 Abscess of the breast and nipple: Secondary | ICD-10-CM

## 2022-09-23 MED ORDER — MUPIROCIN 2 % EX OINT
1.0000 | TOPICAL_OINTMENT | Freq: Two times a day (BID) | CUTANEOUS | 0 refills | Status: DC
Start: 2022-09-23 — End: 2022-11-01

## 2022-09-23 NOTE — Patient Instructions (Addendum)
To help with moisture control I recommend the following Make sure you are washing and cleaning your bras frequently to prevent irritation You can a little bit of corn starch or MegaBabe bust dust to help with moisture control  I have sent in a script for a new antibiotic ointment- I do not recommend using Neosporin or Polysporin as these can irritate the skin and prevent prompt healing  I recommend washing the area with salicylic acid wash to help with preventing further flares You can get this at most pharmacies or stores in the personal care section  The scheduling department will reach out to get your Ob/Gyn apt set up. If you do not hear back from them in the next 1-2 weeks please call us and let us know.   It was nice to meet you and I appreciate the opportunity to be involved in your care If you were satisfied with the care you received from me, I would greatly appreciate you saying so in the after-visit survey that is sent out following our visit.

## 2022-09-23 NOTE — Progress Notes (Signed)
Acute Office Visit   Patient: Veronica Ortega   DOB: Jan 23, 2001   22 y.o. Female  MRN: [id number] Visit Date: 09/23/2022  Today's healthcare provider: Oswaldo Conroy Kejon Feild, PA-C  Introduced myself to the patient as a Secondary school teacher and provided education on APPs in clinical practice.    Chief Complaint  Patient presents with   Breast Problem    Patient says she thinks she may have cyst underneath her breast. Patient says she is noticed one is open and says they appeared about two days ago.    Referral    Patient would like to discuss with provider a referral to Gynecology to have Nexplanon removed. Patient says she has had her implant for about two years now. Patient says she feels it may be causing her heart rate to increase.    Subjective    HPI HPI     Breast Problem    Additional comments: Patient says she thinks she may have cyst underneath her breast. Patient says she is noticed one is open and says they appeared about two days ago.         Referral    Additional comments: Patient would like to discuss with provider a referral to Gynecology to have Nexplanon removed. Patient says she has had her implant for about two years now. Patient says she feels it may be causing her heart rate to increase.       Last edited by Veronica Ortega, CMA on 09/23/2022  3:46 PM.       Breast concern  Onset: new, sudden  Duration: ongoing for about 2 days  Some larger bumps have been present for about 2 weeks  Location: inferior aspect of both breasts  She reports one bump under her left breast has opened and is draining  She has been using Polysporin on the area    Nexplanon Patient had Nexplanon inserted in 2022 and is approaching time for removal Will need referral to Ob/Gyn services for removal and further birth control counseling She is interested in getting Depo after getting Nexplanon removed     Medications: Outpatient Medications Prior to Visit  Medication Sig   albuterol  (VENTOLIN HFA) 108 (90 Base) MCG/ACT inhaler Inhale 2 puffs into the lungs every 6 (six) hours as needed for wheezing or shortness of breath.   etonogestrel (NEXPLANON) 68 MG IMPL implant 1 each by Subdermal route once.   ibuprofen (ADVIL) 600 MG tablet Take 1 tablet (600 mg total) by mouth every 6 (six) hours as needed.   No facility-administered medications prior to visit.    Review of Systems  Constitutional:  Negative for chills and fever.  Skin:  Positive for wound.       Objective    BP 104/69   Pulse 93   Ht 5' (1.524 m)   Wt 199 lb 9.6 oz (90.5 kg)   SpO2 99%   BMI 38.98 kg/m    Physical Exam Vitals reviewed. Exam conducted with a chaperone present.  Constitutional:      General: She is awake.     Appearance: Normal appearance. She is well-developed and well-groomed.  HENT:     Head: Normocephalic and atraumatic.  Chest:     Comments: Chaperone: Veronica Ortega, CMA  Several reddened areas present along both breasts One along inferior aspect of left breast is open and draining serous fluid- no fluctuance, induration, increased warmth, or streaking present Other areas are still closed but  do not appear to be fluctuant or indurated, warm compared to surrounding skin  Skin:    General: Skin is warm and dry.  Neurological:     General: No focal deficit present.     Mental Status: She is alert and oriented to person, place, and time.  Psychiatric:        Mood and Affect: Mood normal.        Behavior: Behavior normal. Behavior is cooperative.        Thought Content: Thought content normal.        Judgment: Judgment normal.       No results found for any visits on 09/23/22.  Assessment & Plan      No follow-ups on file.        Problem List Items Addressed This Visit       Musculoskeletal and Integument   Boil, breast    Acute, recurrent issue per chart review Patient presents today with several cyst/boil lesions along both breasts  Recommend using  Mupirocin for management and to provide bacterial coverage given hx of cellulitis in this same area Reviewed ED and return precautions Reviewed ways to keep the area clean, dry and ways to prevent recurrence- provided in AVS Follow up as needed for persistent or progressing symptoms        Relevant Medications   mupirocin ointment (BACTROBAN) 2 %     Other   Nexplanon in place - Primary    Patient is approaching removal time for Nexplanon insert  Referral to Ob/Gyn services placed to assist with removal and potential replacement per patient preference.       Relevant Orders   Ambulatory referral to Obstetrics / Gynecology     No follow-ups on file.   I, Brant Peets E Slayter Moorhouse, PA-C, have reviewed all documentation for this visit. The documentation on 09/28/22 for the exam, diagnosis, procedures, and orders are all accurate and complete.   Jacquelin Hawking, MHS, PA-C [facility]

## 2022-09-28 DIAGNOSIS — Z975 Presence of (intrauterine) contraceptive device: Secondary | ICD-10-CM | POA: Insufficient documentation

## 2022-09-28 DIAGNOSIS — N611 Abscess of the breast and nipple: Secondary | ICD-10-CM | POA: Insufficient documentation

## 2022-09-28 HISTORY — DX: Presence of (intrauterine) contraceptive device: Z97.5

## 2022-09-28 NOTE — Assessment & Plan Note (Signed)
Acute, recurrent issue per chart review Patient presents today with several cyst/boil lesions along both breasts  Recommend using Mupirocin for management and to provide bacterial coverage given hx of cellulitis in this same area Reviewed ED and return precautions Reviewed ways to keep the area clean, dry and ways to prevent recurrence- provided in AVS Follow up as needed for persistent or progressing symptoms

## 2022-09-28 NOTE — Assessment & Plan Note (Signed)
Patient is approaching removal time for Nexplanon insert  Referral to Ob/Gyn services placed to assist with removal and potential replacement per patient preference.

## 2022-10-06 ENCOUNTER — Encounter: Payer: Self-pay | Admitting: Obstetrics and Gynecology

## 2022-11-01 ENCOUNTER — Ambulatory Visit (INDEPENDENT_AMBULATORY_CARE_PROVIDER_SITE_OTHER): Payer: Medicaid Other | Admitting: Certified Nurse Midwife

## 2022-11-01 ENCOUNTER — Encounter: Payer: Self-pay | Admitting: Certified Nurse Midwife

## 2022-11-01 VITALS — BP 131/80 | HR 110 | Ht 60.0 in | Wt 199.0 lb

## 2022-11-01 DIAGNOSIS — Z3046 Encounter for surveillance of implantable subdermal contraceptive: Secondary | ICD-10-CM

## 2022-11-01 DIAGNOSIS — Z30013 Encounter for initial prescription of injectable contraceptive: Secondary | ICD-10-CM | POA: Diagnosis not present

## 2022-11-01 MED ORDER — MEDROXYPROGESTERONE ACETATE 150 MG/ML IM SUSY
150.0000 mg | PREFILLED_SYRINGE | Freq: Once | INTRAMUSCULAR | Status: AC
Start: 2022-11-01 — End: 2022-11-01
  Administered 2022-11-01: 150 mg via INTRAMUSCULAR

## 2022-11-01 NOTE — Progress Notes (Signed)
    GYNECOLOGY PROCEDURE NOTE  Nexplanon removal discussed in detail, desires removal and transition to depo.  Risks of infection, bleeding, nerve injury all reviewed.  Patient understands risks and desires to proceed.  Consent obtained.    Review of Systems  Constitutional: Negative.   Respiratory: Negative.    Cardiovascular: Negative.     Vital Signs: BP 131/80   Pulse (!) 110   Ht 5' (1.524 m)   Wt 199 lb (90.3 kg)   LMP  (LMP Unknown)   BMI 38.86 kg/m  Constitutional: Well nourished, well developed female in no acute distress.  Skin: Warm and dry.  Cardiovascular: Regular rate.   Respiratory:  Normal respiratory effort Psych: Alert and Oriented x3. No memory deficits. Normal mood and affect.    Procedure: Patient placed in dorsal supine with left arm above head, elbow flexed at 90 degrees, arm resting on examination table.  Nexplanon identified without problems.  Betadine scrub x3.  1 ml of 1% lidocaine injected under Nexplanon device without problems.  Sterile gloves applied.  Small 0.5cm incision made at distal tip of Nexplanon device with 11 blade scalpel.  Nexplanon brought to incision and grasped with a small kelly clamp.  Nexplanon removed intact without problems.  Pressure applied to incision.  Hemostasis obtained.  Steri-strips applied, followed by bandage and compression dressing.  Patient tolerated procedure well.  No complications.   Assessment: 22 y.o. year old female now s/p uncomplicated Nexplanon removal.  Plan: 1.  Patient given post procedure precautions and asked to call for fever, chills, redness or drainage from her incision, bleeding from incision.  She understands she will likely have a small bruise near site of removal and can remove bandage tomorrow and steri-strips in approximately 3-5d.  2) Contraception: Depo, first dose today. Counseled to use back up contraception (abstinence or condoms) for 7d. Does not protect against STIs.   Dominica Severin, CNM

## 2022-12-17 ENCOUNTER — Ambulatory Visit (INDEPENDENT_AMBULATORY_CARE_PROVIDER_SITE_OTHER): Payer: Medicaid Other | Admitting: Physician Assistant

## 2022-12-17 ENCOUNTER — Encounter: Payer: Self-pay | Admitting: Physician Assistant

## 2022-12-17 VITALS — BP 123/71 | HR 82 | Temp 98.8°F | Wt 200.0 lb

## 2022-12-17 DIAGNOSIS — Z23 Encounter for immunization: Secondary | ICD-10-CM | POA: Diagnosis not present

## 2022-12-17 DIAGNOSIS — N611 Abscess of the breast and nipple: Secondary | ICD-10-CM | POA: Diagnosis not present

## 2022-12-17 DIAGNOSIS — H7291 Unspecified perforation of tympanic membrane, right ear: Secondary | ICD-10-CM | POA: Diagnosis not present

## 2022-12-17 DIAGNOSIS — Z3200 Encounter for pregnancy test, result unknown: Secondary | ICD-10-CM

## 2022-12-17 LAB — PREGNANCY, URINE: Preg Test, Ur: NEGATIVE

## 2022-12-17 MED ORDER — AEROCHAMBER PLUS FLO-VU MISC: 1 refills | Status: DC

## 2022-12-17 NOTE — Assessment & Plan Note (Signed)
Acute on chronic, appears to be improving  Several areas of well-healing scars along both breasts- no active sores or lesions present today She has been keeping area clean and dry since previous visit and notes improvement in symptoms Continue with current measures. If she continues to have cysts and lesions may need to refer to Derm for eval for hidradenitis suppirativa  Follow up as needed for persistent or progressing symptoms

## 2022-12-17 NOTE — Progress Notes (Signed)
Pregnancy test negative

## 2022-12-17 NOTE — Progress Notes (Signed)
Acute Office Visit   Patient: Veronica Ortega   DOB: 2000-12-29   22 y.o. Female  MRN: [id number] Visit Date: 12/17/2022  Today's healthcare provider: Oswaldo Conroy Jeslie Lowe, PA-C  Introduced myself to the patient as a Secondary school teacher and provided education on APPs in clinical practice.    Chief Complaint  Patient presents with   Breast Problem    Wants to recheck spots on breast   Subjective    HPI HPI     Breast Problem    Additional comments: Wants to recheck spots on breast      Last edited by Andre Lefort, CMA on 12/17/2022  2:54 PM.       Breast concerns   Patient was seen on 09/23/22 for boils and dermal cysts of her breasts - was given Mupirocin for management  She thinks they are improving - she has been using a drying powder everyday which seems to be helping She reports having some intermittent dermal cysts since her last visit but they go away on their own   She has changed her birth control from nexplanon to depo She is concerned for loss of sexual desire - states it was reduced prior to nexplanon removal - thinks it started in July   She is interested in switching to IUD - informed her that she could get this done here or at OB/GYN per preference  She reports intermittent ear pain - has been using peroxide to clean   Medications: Outpatient Medications Prior to Visit  Medication Sig   albuterol (VENTOLIN HFA) 108 (90 Base) MCG/ACT inhaler Inhale 2 puffs into the lungs every 6 (six) hours as needed for wheezing or shortness of breath. (Patient not taking: Reported on 12/17/2022)   etonogestrel (NEXPLANON) 68 MG IMPL implant 1 each by Subdermal route once. (Patient not taking: Reported on 11/01/2022)   ibuprofen (ADVIL) 600 MG tablet Take 1 tablet (600 mg total) by mouth every 6 (six) hours as needed. (Patient not taking: Reported on 12/17/2022)   No facility-administered medications prior to visit.    Review of Systems      Objective    BP 123/71   Pulse 82    Temp 98.8 F (37.1 C) (Oral)   Wt 200 lb (90.7 kg)   SpO2 98%   BMI 39.06 kg/m     Physical Exam Vitals reviewed. Exam conducted with a chaperone present.  Constitutional:      General: She is awake.     Appearance: Normal appearance. She is well-developed and well-groomed.  HENT:     Head: Normocephalic and atraumatic.     Right Ear: Hearing and ear canal normal. Tympanic membrane is scarred, perforated and retracted. Tympanic membrane is not injected, erythematous or bulging.     Left Ear: Hearing and ear canal normal. Tympanic membrane is scarred. Tympanic membrane is not injected, perforated, erythematous, retracted or bulging.  Pulmonary:     Effort: Pulmonary effort is normal.  Chest:     Chest wall: No mass, deformity, swelling or tenderness. There is no dullness to percussion.  Breasts:    Tanner Score is 5.     Breasts are symmetrical.     Right: Normal.     Left: Normal.     Comments: Chaperoned by Maggie Font, CMA  Several small mobile, round cysts/nodules  present bilaterally  Patient has several well-healing scars from previous dermal cysts on each breast, no drainage, erythema or tenderness noted  Musculoskeletal:     Cervical back: Normal range of motion.  Lymphadenopathy:     Upper Body:     Right upper body: No supraclavicular, axillary or pectoral adenopathy.     Left upper body: No supraclavicular, axillary or pectoral adenopathy.  Neurological:     General: No focal deficit present.     Mental Status: She is alert and oriented to person, place, and time. Mental status is at baseline.  Psychiatric:        Mood and Affect: Mood normal.        Behavior: Behavior normal. Behavior is cooperative.        Thought Content: Thought content normal.        Judgment: Judgment normal.       No results found for any visits on 12/17/22.  Assessment & Plan      No follow-ups on file.      Problem List Items Addressed This Visit       Musculoskeletal  and Integument   Boil, breast    Acute on chronic, appears to be improving  Several areas of well-healing scars along both breasts- no active sores or lesions present today She has been keeping area clean and dry since previous visit and notes improvement in symptoms Continue with current measures. If she continues to have cysts and lesions may need to refer to Derm for eval for hidradenitis suppirativa  Follow up as needed for persistent or progressing symptoms        Other Visit Diagnoses     Perforation of right tympanic membrane    -  Primary Patient reports some ear pain about a week ago then relief after using peroxide Tympanic membrane of right ear appears ruptured today but no signs of drainage, erythema, swelling at this time Reviewed she should return to office if she has similar symptoms again Monitor for now.  Follow up as needed for persistent or progressing symptoms     Encounter for pregnancy test, result unknown       Relevant Orders   Pregnancy, urine   Influenza vaccine needed       Relevant Orders   Flu vaccine trivalent PF, 6mos and older(Flulaval,Afluria,Fluarix,Fluzone) (Completed)        No follow-ups on file.   I, Kellsey Sansone E Rane Dumm, PA-C, have reviewed all documentation for this visit. The documentation on 12/17/22 for the exam, diagnosis, procedures, and orders are all accurate and complete.   Jacquelin Hawking, MHS, PA-C [facility]

## 2022-12-29 ENCOUNTER — Ambulatory Visit (INDEPENDENT_AMBULATORY_CARE_PROVIDER_SITE_OTHER): Payer: Medicaid Other | Admitting: Nurse Practitioner

## 2022-12-29 ENCOUNTER — Encounter: Payer: Self-pay | Admitting: Nurse Practitioner

## 2022-12-29 DIAGNOSIS — F325 Major depressive disorder, single episode, in full remission: Secondary | ICD-10-CM | POA: Diagnosis not present

## 2022-12-29 MED ORDER — SERTRALINE HCL 25 MG PO TABS: 25.0000 mg | ORAL_TABLET | Freq: Every day | ORAL | 0 refills | Status: DC

## 2022-12-29 NOTE — Progress Notes (Signed)
BP 124/83   Pulse 96   Temp 98.5 F (36.9 C) (Oral)   Ht 5' (1.524 m)   Wt 202 lb 3.2 oz (91.7 kg)   SpO2 98%   BMI 39.49 kg/m    Subjective:    Patient ID: Veronica Ortega, female    DOB: [birth year], 22 y.o.   MRN: [id number]  HPI: Veronica Ortega is a 22 y.o. female  Chief Complaint  Patient presents with   Ear Pain    Patient states she was having left ear pain for the last few days. States she is not really feeling any pain right now. States she was told last time that her R eardrum was busted.    Depression   EAR PAIN Duration: days Involved ear(s): right Severity:  7/10  Quality:  aching Fever: no Otorrhea: no Upper respiratory infection symptoms: no Pruritus: no Hearing loss: no Water immersion no Using Q-tips: no Recurrent otitis media: no Status: better Treatments attempted: none  DEPRESSION Patient has been thinking about trying an antidepressant.  She has been on medication in the past but can't remember the names.  She feels like her depression is worse than anxiety but does feel like she has some both.  Sometimes she gets in sad moods and doesn't know what to do.  She used to cut her arms and thighs but hasn't done that in over a year.  Denies any SI or thoughts of SH.  Flowsheet Row Office Visit from 12/29/2022 in [facility]  PHQ-9 Total Score 5         12/29/2022    1:35 PM 12/17/2022    2:58 PM 09/23/2022    3:50 PM 08/19/2022    2:35 PM  GAD 7 : Generalized Anxiety Score  Nervous, Anxious, on Edge 0 0 1 0  Control/stop worrying 0 0 0 0  Worry too much - different things 1 0 0 0  Trouble relaxing 0 0 0 0  Restless 0 0 0 0  Easily annoyed or irritable 1 1 0 0  Afraid - awful might happen 0 0 0 0  Total GAD 7 Score 2 1 1  0  Anxiety Difficulty Not difficult at all Not difficult at all Not difficult at all       Relevant past medical, surgical, family and social history reviewed and updated as indicated. Interim medical  history since our last visit reviewed. Allergies and medications reviewed and updated.  Review of Systems  Psychiatric/Behavioral:  Positive for dysphoric mood. Negative for self-injury and suicidal ideas. The patient is nervous/anxious.     Per HPI unless specifically indicated above     Objective:    BP 124/83   Pulse 96   Temp 98.5 F (36.9 C) (Oral)   Ht 5' (1.524 m)   Wt 202 lb 3.2 oz (91.7 kg)   SpO2 98%   BMI 39.49 kg/m   Wt Readings from Last 3 Encounters:  12/29/22 202 lb 3.2 oz (91.7 kg)  12/17/22 200 lb (90.7 kg)  11/01/22 199 lb (90.3 kg)    Physical Exam Vitals and nursing note reviewed.  Constitutional:      General: She is not in acute distress.    Appearance: Normal appearance. She is obese. She is not ill-appearing, toxic-appearing or diaphoretic.  HENT:     Head: Normocephalic.     Right Ear: External ear normal.     Left Ear: External ear normal.     Nose: Nose  normal.     Mouth/Throat:     Mouth: Mucous membranes are moist.     Pharynx: Oropharynx is clear.  Eyes:     General:        Right eye: No discharge.        Left eye: No discharge.     Extraocular Movements: Extraocular movements intact.     Conjunctiva/sclera: Conjunctivae normal.     Pupils: Pupils are equal, round, and reactive to light.  Cardiovascular:     Rate and Rhythm: Normal rate and regular rhythm.     Heart sounds: No murmur heard. Pulmonary:     Effort: Pulmonary effort is normal. No respiratory distress.     Breath sounds: Normal breath sounds. No wheezing or rales.  Musculoskeletal:     Cervical back: Normal range of motion and neck supple.  Skin:    General: Skin is warm and dry.     Capillary Refill: Capillary refill takes less than 2 seconds.  Neurological:     General: No focal deficit present.     Mental Status: She is alert and oriented to person, place, and time. Mental status is at baseline.  Psychiatric:        Mood and Affect: Mood normal.         Behavior: Behavior normal.        Thought Content: Thought content normal.        Judgment: Judgment normal.     Results for orders placed or performed in visit on 12/17/22  Pregnancy, urine  Result Value Ref Range   Preg Test, Ur Negative Negative      Assessment & Plan:   Problem List Items Addressed This Visit       Other   Depression, major, single episode, complete remission (HCC)    Chronic. Not well controlled. Will start Zoloft 25mg  daily.  Side effects and benefits discussed. Reviewed crisis hotline information, ED precautions.  Does have safe person to talk to.  Follow up in 1 month.  Call sooner if concerns arise.       Relevant Medications   sertraline (ZOLOFT) 25 MG tablet     Follow up plan: Return in about 1 month (around 01/29/2023) for Depression/Anxiety FU.

## 2022-12-29 NOTE — Assessment & Plan Note (Signed)
Chronic. Not well controlled. Will start Zoloft 25mg  daily.  Side effects and benefits discussed. Reviewed crisis hotline information, ED precautions.  Does have safe person to talk to.  Follow up in 1 month.  Call sooner if concerns arise.

## 2023-01-14 ENCOUNTER — Encounter: Payer: Self-pay | Admitting: Certified Nurse Midwife

## 2023-01-14 ENCOUNTER — Ambulatory Visit (INDEPENDENT_AMBULATORY_CARE_PROVIDER_SITE_OTHER): Payer: Medicaid Other | Admitting: Certified Nurse Midwife

## 2023-01-14 VITALS — BP 124/79 | HR 88 | Wt 197.5 lb

## 2023-01-14 DIAGNOSIS — Z30430 Encounter for insertion of intrauterine contraceptive device: Secondary | ICD-10-CM | POA: Diagnosis not present

## 2023-01-14 MED ORDER — PARAGARD INTRAUTERINE COPPER IU IUD
1.0000 | INTRAUTERINE_SYSTEM | Freq: Once | INTRAUTERINE | Status: AC
  Administered 2023-01-14: 1 via INTRAUTERINE

## 2023-01-14 NOTE — Progress Notes (Unsigned)
   Chief Complaint  Patient presents with   Contraception     IUD PROCEDURE NOTE:  Veronica Ortega is a 22 y.o. G0P0000 here for Paragard  IUD insertion for contraception. She has used depo, last does 8/19 and had Nexplanon removed earlier this year. She is interested in a longer term contraceptive that is hormone free.   BP 124/79   Pulse 88   Wt 197 lb 8 oz (89.6 kg)   BMI 38.57 kg/m   IUD Insertion Procedure Note Patient identified, informed consent performed, consent signed.   Discussed risks of irregular bleeding, cramping, infection, malpositioning or misplacement of the IUD outside the uterus which may require further procedure such as laparoscopy, risk of failure <1%. Time out was performed.  Urine pregnancy test negative.  Speculum placed in the vagina.  Cervix visualized.  Cleaned with Betadine x 3.  Grasped anteriorly with a single tooth tenaculum.  Uterus sounded to 7 cm.   IUD placed per manufacturer's recommendations.  Strings trimmed to 3 cm. Tenaculum was removed, good hemostasis noted.  Patient tolerated procedure well.   ASSESSMENT:  Encounter for insertion of intrauterine contraceptive device (IUD)   Meds ordered this encounter  Medications   paragard intrauterine copper IUD 1 each     Plan:  Patient was given post-procedure instructions.  She was advised to continue to use condoms for STI prevention.   Call if you are having increasing pain, cramps or bleeding or if you have a fever greater than 100.4 degrees F., shaking chills, nausea or vomiting. Patient was also asked to check IUD strings periodically and follow up in 4 weeks for IUD check.  No follow-ups on file.  Dominica Severin, CNM 01/14/2023 4:44 PM

## 2023-01-17 ENCOUNTER — Telehealth: Payer: Self-pay

## 2023-01-17 ENCOUNTER — Ambulatory Visit: Payer: Medicaid Other

## 2023-01-17 NOTE — Telephone Encounter (Signed)
Pt stated she has been having issues since the IUD placement on Friday, some light spotting and a lot of painful cramping, stated that it hurt even to walk, I let her know we did have openings today if she wanted to seen, she stated she didn't have a ride today until Thursday and already scheduled apt for this Thursday, I let her know to take ibuprofen and use a heating pad or she can go after hours to the urgent care or ed as she does not have a ride. Pt understood and stated she would wait for Her apt Thursday.

## 2023-01-20 ENCOUNTER — Ambulatory Visit (INDEPENDENT_AMBULATORY_CARE_PROVIDER_SITE_OTHER): Payer: Medicaid Other | Admitting: Certified Nurse Midwife

## 2023-01-20 ENCOUNTER — Encounter: Payer: Self-pay | Admitting: Certified Nurse Midwife

## 2023-01-20 VITALS — BP 113/73 | HR 79 | Wt 203.2 lb

## 2023-01-20 DIAGNOSIS — Z975 Presence of (intrauterine) contraceptive device: Secondary | ICD-10-CM

## 2023-01-20 DIAGNOSIS — R109 Unspecified abdominal pain: Secondary | ICD-10-CM | POA: Diagnosis not present

## 2023-01-20 NOTE — Progress Notes (Signed)
Larae Grooms, NP   Chief Complaint  Patient presents with   Contraception    HPI:      Veronica Ortega is a 22 y.o. G0P0000 whose LMP was Patient's last menstrual period was 01/19/2023 (exact date)., presents today for continued cramping after IUD. Cramping was the worst on Saturday & Sunday, heat, tylenol & Ibuprofen did not relieve. Period started yesterday, cramping now mild. She has been taking ibuprofen 200mg  every 6h and tylenol 500mg  every 6h for pain. Now able to complete usual activities and pain is more like a regular period. Denies fever, chills, foul odor to vaginal discharge or other signs of infection.     Patient Active Problem List   Diagnosis Date Noted   IUD (intrauterine device) in place 01/20/2023   Boil, breast 09/28/2022   Asthma    Depression, major, single episode, complete remission (HCC)    Seasonal allergic rhinitis due to pollen 01/03/2015    Past Surgical History:  Procedure Laterality Date   Addenoids Removed     TONSILLECTOMY     TYMPANOSTOMY TUBE PLACEMENT      Family History  Problem Relation Age of Onset   Asthma Mother    Depression Mother    Asthma Maternal Grandmother    Diabetes Maternal Grandmother    Hypertension Maternal Grandmother     Social History   Socioeconomic History   Marital status: Significant Other    Spouse name: Not on file   Number of children: Not on file   Years of education: Not on file   Highest education level: Not on file  Occupational History   Not on file  Tobacco Use   Smoking status: Former    Types: Cigarettes   Smokeless tobacco: Never  Vaping Use   Vaping status: Former  Substance and Sexual Activity   Alcohol use: No   Drug use: Not Currently    Types: Marijuana   Sexual activity: Yes    Partners: Male    Birth control/protection: Injection  Other Topics Concern   Not on file  Social History Narrative   Not on file   Social Determinants of Health   Financial Resource  Strain: Not on file  Food Insecurity: Not on file  Transportation Needs: Not on file  Physical Activity: Not on file  Stress: Not on file  Social Connections: Not on file  Intimate Partner Violence: Not At Risk (12/03/2020)   Humiliation, Afraid, Rape, and Kick questionnaire    Fear of Current or Ex-Partner: No    Emotionally Abused: No    Physically Abused: No    Sexually Abused: No    Outpatient Medications Prior to Visit  Medication Sig Dispense Refill   ibuprofen (ADVIL) 600 MG tablet Take 1 tablet (600 mg total) by mouth every 6 (six) hours as needed. 30 tablet 0   sertraline (ZOLOFT) 25 MG tablet Take 1 tablet (25 mg total) by mouth daily. 30 tablet 0   No facility-administered medications prior to visit.      ROS:  Review of Systems  Constitutional: Negative.   Respiratory: Negative.    Cardiovascular: Negative.   Genitourinary:  Positive for pelvic pain.     OBJECTIVE:   Vitals:  BP 113/73   Pulse 79   Wt 203 lb 3.2 oz (92.2 kg)   LMP 01/19/2023 (Exact Date)   BMI 39.68 kg/m   Physical Exam Constitutional:      Appearance: Normal appearance.  Cardiovascular:  Rate and Rhythm: Normal rate.  Pulmonary:     Effort: Pulmonary effort is normal.  Genitourinary:    General: Normal vulva.     Vagina: Bleeding present.     Cervix: No cervical motion tenderness.     Uterus: Normal.      Comments: IUD strings visible at cervical os. IUD not felt on bimanual exam. Skin:    General: Skin is warm and dry.  Neurological:     General: No focal deficit present.     Mental Status: She is alert and oriented to person, place, and time.  Psychiatric:        Mood and Affect: Mood normal.        Behavior: Behavior normal.     Results: No results found for this or any previous visit (from the past 24 hour(s)).   Assessment/Plan: IUD (intrauterine device) in place  Abdominal cramping  IUD in place on exam, resolving discomfort consistent with IUD  placement and resumption of menses, Judythe had no cycle due to depo & Nexplanon. Discussed expectations of bleeding & cramping post-Paragard placement, warning signs reinforced.  No orders of the defined types were placed in this encounter.    Dominica Severin, CNM 01/20/2023 11:14 AM

## 2023-02-01 ENCOUNTER — Ambulatory Visit: Payer: Medicaid Other | Admitting: Nurse Practitioner

## 2023-02-28 ENCOUNTER — Encounter: Payer: Self-pay | Admitting: Nurse Practitioner

## 2023-02-28 ENCOUNTER — Ambulatory Visit (INDEPENDENT_AMBULATORY_CARE_PROVIDER_SITE_OTHER): Payer: Medicaid Other | Admitting: Nurse Practitioner

## 2023-02-28 VITALS — BP 114/69 | HR 82 | Temp 97.7°F | Ht 61.0 in | Wt 199.6 lb

## 2023-02-28 DIAGNOSIS — F325 Major depressive disorder, single episode, in full remission: Secondary | ICD-10-CM | POA: Diagnosis not present

## 2023-02-28 DIAGNOSIS — L731 Pseudofolliculitis barbae: Secondary | ICD-10-CM

## 2023-02-28 NOTE — Progress Notes (Signed)
BP 114/69 (BP Location: Left Arm, Patient Position: Sitting, Cuff Size: Large)   Pulse 82   Temp 97.7 F (36.5 C) (Oral)   Ht 5\' 1"  (1.549 m)   Wt 199 lb 9.6 oz (90.5 kg)   SpO2 97%   BMI 37.71 kg/m    Subjective:    Patient ID: Veronica Ortega, female    DOB: 2001/03/09, 22 y.o.   MRN: [id number]  HPI: Veronica Ortega is a 22 y.o. female  Chief Complaint  Patient presents with   Depression    Would like to come off the anti-depressants, stopped taking last month   spot    Inner thigh   breast exam   DEPRESSION Patient states she stopped her zoloft.  She doesn't feel like she needs the medication anymore.  She has been off of it for about a month.  Denies SI.   Flowsheet Row Office Visit from 02/28/2023 in [facility]  PHQ-9 Total Score 0         02/28/2023    3:52 PM 12/29/2022    1:35 PM 12/17/2022    2:58 PM 09/23/2022    3:50 PM  GAD 7 : Generalized Anxiety Score  Nervous, Anxious, on Edge 0 0 0 1  Control/stop worrying 0 0 0 0  Worry too much - different things 0 1 0 0  Trouble relaxing 0 0 0 0  Restless 0 0 0 0  Easily annoyed or irritable 0 1 1 0  Afraid - awful might happen 0 0 0 0  Total GAD 7 Score 0 2 1 1   Anxiety Difficulty  Not difficult at all Not difficult at all Not difficult at all    Patient states she has bumps on her thigh that she needs to have looked at.  She also is requesting a breast exam due to having full breasts.  She does home breast exams.   Relevant past medical, surgical, family and social history reviewed and updated as indicated. Interim medical history since our last visit reviewed. Allergies and medications reviewed and updated.  Review of Systems  Skin:        Bump on left thigh  Psychiatric/Behavioral:  Positive for dysphoric mood. Negative for self-injury and suicidal ideas. The patient is nervous/anxious.     Per HPI unless specifically indicated above     Objective:    BP 114/69 (BP  Location: Left Arm, Patient Position: Sitting, Cuff Size: Large)   Pulse 82   Temp 97.7 F (36.5 C) (Oral)   Ht 5\' 1"  (1.549 m)   Wt 199 lb 9.6 oz (90.5 kg)   SpO2 97%   BMI 37.71 kg/m   Wt Readings from Last 3 Encounters:  02/28/23 199 lb 9.6 oz (90.5 kg)  01/20/23 203 lb 3.2 oz (92.2 kg)  01/14/23 197 lb 8 oz (89.6 kg)    Physical Exam Vitals and nursing note reviewed. Exam conducted with a chaperone present Oswaldo Conroy, CMA).  Constitutional:      General: She is not in acute distress.    Appearance: Normal appearance. She is obese. She is not ill-appearing, toxic-appearing or diaphoretic.  HENT:     Head: Normocephalic.     Right Ear: External ear normal.     Left Ear: External ear normal.     Nose: Nose normal.     Mouth/Throat:     Mouth: Mucous membranes are moist.     Pharynx: Oropharynx is clear.  Eyes:  General:        Right eye: No discharge.        Left eye: No discharge.     Extraocular Movements: Extraocular movements intact.     Conjunctiva/sclera: Conjunctivae normal.     Pupils: Pupils are equal, round, and reactive to light.  Cardiovascular:     Rate and Rhythm: Normal rate and regular rhythm.     Heart sounds: No murmur heard. Pulmonary:     Effort: Pulmonary effort is normal. No respiratory distress.     Breath sounds: Normal breath sounds. No wheezing or rales.  Chest:  Breasts:    Right: No swelling, bleeding, inverted nipple, mass, nipple discharge, skin change or tenderness.     Left: Normal. No swelling, bleeding, inverted nipple, mass, nipple discharge, skin change or tenderness.  Musculoskeletal:     Cervical back: Normal range of motion and neck supple.  Skin:    General: Skin is warm and dry.     Capillary Refill: Capillary refill takes less than 2 seconds.       Neurological:     General: No focal deficit present.     Mental Status: She is alert and oriented to person, place, and time. Mental status is at baseline.   Psychiatric:        Mood and Affect: Mood normal.        Behavior: Behavior normal.        Thought Content: Thought content normal.        Judgment: Judgment normal.     Results for orders placed or performed in visit on 12/17/22  Pregnancy, urine   Collection Time: 12/17/22  3:48 PM  Result Value Ref Range   Preg Test, Ur Negative Negative      Assessment & Plan:   Problem List Items Addressed This Visit       Other   Depression, major, single episode, complete remission (HCC)   Chronic.  Controlled without medication per patient.  Return to clinic in 6 months for reevaluation.  Call sooner if concerns arise.        Other Visit Diagnoses       Ingrown hair    -  Primary   Resolving on left thigh.         Follow up plan: Return in about 3 months (around 05/29/2023) for Physical and Fasting labs.

## 2023-03-01 NOTE — Assessment & Plan Note (Signed)
Chronic.  Controlled without medication per patient.  Return to clinic in 6 months for reevaluation.  Call sooner if concerns arise.

## 2023-03-02 ENCOUNTER — Other Ambulatory Visit (HOSPITAL_COMMUNITY)
Admission: RE | Admit: 2023-03-02 | Discharge: 2023-03-02 | Disposition: A | Discharge status: Home / Self Care | Payer: Medicaid Other | Source: Ambulatory Visit | Attending: Certified Nurse Midwife | Admitting: Certified Nurse Midwife

## 2023-03-02 ENCOUNTER — Ambulatory Visit: Payer: Medicaid Other | Admitting: Certified Nurse Midwife

## 2023-03-02 VITALS — BP 114/76 | HR 80 | Ht 61.0 in | Wt 199.4 lb

## 2023-03-02 DIAGNOSIS — Z113 Encounter for screening for infections with a predominantly sexual mode of transmission: Secondary | ICD-10-CM | POA: Insufficient documentation

## 2023-03-02 DIAGNOSIS — N898 Other specified noninflammatory disorders of vagina: Secondary | ICD-10-CM | POA: Diagnosis not present

## 2023-03-02 DIAGNOSIS — N926 Irregular menstruation, unspecified: Secondary | ICD-10-CM

## 2023-03-02 DIAGNOSIS — Z3202 Encounter for pregnancy test, result negative: Secondary | ICD-10-CM | POA: Diagnosis not present

## 2023-03-02 LAB — POCT URINE PREGNANCY: Preg Test, Ur: NEGATIVE

## 2023-03-02 NOTE — Progress Notes (Signed)
Larae Grooms, NP   Chief Complaint  Patient presents with   Vaginal Discharge         HPI:      Veronica Ortega is a 22 y.o. G0P0000 whose LMP was Patient's last menstrual period was 01/19/2023 (exact date)., presents today for increased vaginal discharge, occasionally with odor. Paragard in place, denies concerns. Has not had a cycle this month, unsure if they are usually regular or not, last had Depo 8/19 and had no cycle while on Depo or with Nexplanon previously.    Patient Active Problem List   Diagnosis Date Noted   IUD (intrauterine device) in place 01/20/2023   Boil, breast 09/28/2022   Asthma    Depression, major, single episode, complete remission (HCC)    Seasonal allergic rhinitis due to pollen 01/03/2015    Past Surgical History:  Procedure Laterality Date   Addenoids Removed     TONSILLECTOMY     TYMPANOSTOMY TUBE PLACEMENT      Family History  Problem Relation Age of Onset   Asthma Mother    Depression Mother    Asthma Maternal Grandmother    Diabetes Maternal Grandmother    Hypertension Maternal Grandmother     Social History   Socioeconomic History   Marital status: Significant Other    Spouse name: Not on file   Number of children: Not on file   Years of education: Not on file   Highest education level: Not on file  Occupational History   Not on file  Tobacco Use   Smoking status: Former    Types: Cigarettes   Smokeless tobacco: Never  Vaping Use   Vaping status: Former  Substance and Sexual Activity   Alcohol use: No   Drug use: Not Currently    Types: Marijuana   Sexual activity: Yes    Partners: Male    Birth control/protection: Injection  Other Topics Concern   Not on file  Social History Narrative   Not on file   Social Drivers of Health   Financial Resource Strain: Not on file  Food Insecurity: Not on file  Transportation Needs: Not on file  Physical Activity: Not on file  Stress: Not on file  Social  Connections: Not on file  Intimate Partner Violence: Not At Risk (12/03/2020)   Humiliation, Afraid, Rape, and Kick questionnaire    Fear of Current or Ex-Partner: No    Emotionally Abused: No    Physically Abused: No    Sexually Abused: No    Outpatient Medications Prior to Visit  Medication Sig Dispense Refill   ibuprofen (ADVIL) 600 MG tablet Take 1 tablet (600 mg total) by mouth every 6 (six) hours as needed. 30 tablet 0   No facility-administered medications prior to visit.      ROS:  Review of Systems  Constitutional: Negative.   Respiratory: Negative.    Cardiovascular: Negative.   Genitourinary:  Positive for vaginal discharge. Negative for dyspareunia, dysuria and pelvic pain.     OBJECTIVE:   Vitals:  BP 114/76   Pulse 80   Ht 5\' 1"  (1.549 m)   Wt 199 lb 6.4 oz (90.4 kg)   LMP 01/19/2023 (Exact Date)   BMI 37.68 kg/m   Physical Exam Vitals reviewed.  Constitutional:      Appearance: Normal appearance.  Cardiovascular:     Rate and Rhythm: Normal rate.  Pulmonary:     Effort: Pulmonary effort is normal.  Genitourinary:    General: Normal  vulva.     Cervix: No cervical motion tenderness or cervical bleeding.     Uterus: Not tender.      Adnexa:        Right: No mass or tenderness.         Left: No mass or tenderness.       Comments: Vagina pink, well rugated, dark brown discharge small amount in vault. Cervical mucous present at os, IUD strings visualized. Uterus smooth, mobile, nontender, IUD not palpated in os. Aptima collected Neurological:     General: No focal deficit present.     Mental Status: She is alert and oriented to person, place, and time.  Psychiatric:        Mood and Affect: Mood normal.        Behavior: Behavior normal.     Results: Results for orders placed or performed in visit on 03/02/23 (from the past 24 hours)  POCT urine pregnancy     Status: None   Collection Time: 03/02/23  3:32 PM  Result Value Ref Range   Preg  Test, Ur Negative Negative     Assessment/Plan: Vaginal discharge - Plan: Cervicovaginal ancillary only  Missed menses - Plan: POCT urine pregnancy  Screen for sexually transmitted diseases - Plan: Cervicovaginal ancillary only   Likely increased cervical mucous due to IUD placement. Aptima collected to screen for infection. Menstrual cycle irregularities likely continued resolution of Depo, pregnancy test negative today.  No orders of the defined types were placed in this encounter.    Dominica Severin, CNM 03/02/2023 3:54 PM

## 2023-03-04 LAB — CERVICOVAGINAL ANCILLARY ONLY
Bacterial Vaginitis (gardnerella): NEGATIVE
Candida Glabrata: NEGATIVE
Candida Vaginitis: NEGATIVE
Chlamydia: NEGATIVE
Comment: NEGATIVE
Comment: NEGATIVE
Comment: NEGATIVE
Comment: NEGATIVE
Comment: NEGATIVE
Comment: NORMAL
Neisseria Gonorrhea: NEGATIVE
Trichomonas: NEGATIVE

## 2023-03-07 ENCOUNTER — Telehealth: Payer: Self-pay

## 2023-03-07 NOTE — Telephone Encounter (Signed)
Pt calling for test results; read [facility]'s mychart msg to her.

## 2023-03-21 ENCOUNTER — Ambulatory Visit: Payer: Medicaid Other | Admitting: Nurse Practitioner

## 2023-03-21 ENCOUNTER — Encounter: Payer: Self-pay | Admitting: Nurse Practitioner

## 2023-03-21 VITALS — BP 121/81 | HR 80 | Temp 97.5°F | Ht 60.5 in | Wt 205.8 lb

## 2023-03-21 DIAGNOSIS — Z0000 Encounter for general adult medical examination without abnormal findings: Secondary | ICD-10-CM

## 2023-03-21 DIAGNOSIS — Z136 Encounter for screening for cardiovascular disorders: Secondary | ICD-10-CM

## 2023-03-21 DIAGNOSIS — E669 Obesity, unspecified: Secondary | ICD-10-CM

## 2023-03-21 DIAGNOSIS — Z23 Encounter for immunization: Secondary | ICD-10-CM | POA: Diagnosis not present

## 2023-03-21 LAB — URINALYSIS, ROUTINE W REFLEX MICROSCOPIC
Bilirubin, UA: NEGATIVE
Glucose, UA: NEGATIVE
Ketones, UA: NEGATIVE
Leukocytes,UA: NEGATIVE
Nitrite, UA: NEGATIVE
Specific Gravity, UA: >1.03 — ABNORMAL HIGH (ref 1.005–1.030)
Urobilinogen, Ur: 0.2 mg/dL (ref 0.2–1.0)
pH, UA: 6 (ref 5.0–7.5)

## 2023-03-21 LAB — MICROSCOPIC EXAMINATION

## 2023-03-21 NOTE — Assessment & Plan Note (Signed)
 Recommended eating smaller high protein, low fat meals more frequently and exercising 30 mins a day 5 times a week with a goal of 10-15lb weight loss in the next 3 months.

## 2023-03-21 NOTE — Progress Notes (Signed)
 BP 121/81 (BP Location: Left Arm, Patient Position: Sitting, Cuff Size: Large)   Pulse 80   Temp (!) 97.5 F (36.4 C) (Oral)   Ht 5' 0.5 (1.537 m)   Wt 205 lb 12.8 oz (93.4 kg)   LMP 01/19/2023 (Exact Date)   SpO2 98%   BMI 39.53 kg/m    Subjective:    Patient ID: Veronica Ortega, female    DOB: 2001/01/13, 23 y.o.   MRN: [id number]  HPI: Veronica Ortega is a 23 y.o. female presenting on 03/21/2023 for comprehensive medical examination. Current medical complaints include: weight  She currently lives with: Menopausal Symptoms: no  Depression Screen done today and results listed below:     03/21/2023    9:45 AM 02/28/2023    3:52 PM 12/29/2022    1:34 PM 12/17/2022    2:57 PM 09/23/2022    3:50 PM  Depression screen PHQ 2/9  Decreased Interest 0 0 1 0 0  Down, Depressed, Hopeless 0 0 1 1 0  PHQ - 2 Score 0 0 2 1 0  Altered sleeping 0 0 0 1 0  Tired, decreased energy 0 0 1 0 0  Change in appetite 0 0 0 0 1  Feeling bad or failure about yourself  0 0 1 1 0  Trouble concentrating 0 0 0 0 0  Moving slowly or fidgety/restless 0 0 0 0 0  Suicidal thoughts 0 0 1 0 0  PHQ-9 Score 0 0 5 3 1   Difficult doing work/chores   Somewhat difficult Not difficult at all Not difficult at all    The patient does not have a history of falls. I did complete a risk assessment for falls. A plan of care for falls was documented.   Past Medical History:  Past Medical History:  Diagnosis Date   Asthma    Nexplanon  in place 09/28/2022    Surgical History:  Past Surgical History:  Procedure Laterality Date   Addenoids Removed     TONSILLECTOMY     TYMPANOSTOMY TUBE PLACEMENT      Medications:  Current Outpatient Medications on File Prior to Visit  Medication Sig   Albuterol Sulfate (PROAIR RESPICLICK) 108 (90 Base) MCG/ACT AEPB Inhale into the lungs.   ibuprofen  (ADVIL ) 600 MG tablet Take 1 tablet (600 mg total) by mouth every 6 (six) hours as needed.   No current facility-administered  medications on file prior to visit.    Allergies:  No Known Allergies  Social History:  Social History   Socioeconomic History   Marital status: Significant Other    Spouse name: Not on file   Number of children: Not on file   Years of education: Not on file   Highest education level: Not on file  Occupational History   Not on file  Tobacco Use   Smoking status: Former    Types: Cigarettes   Smokeless tobacco: Never  Vaping Use   Vaping status: Former  Substance and Sexual Activity   Alcohol use: No   Drug use: Not Currently    Types: Marijuana   Sexual activity: Yes    Partners: Male    Birth control/protection: Injection  Other Topics Concern   Not on file  Social History Narrative   Not on file   Social Drivers of Health   Financial Resource Strain: Not on file  Food Insecurity: Not on file  Transportation Needs: Not on file  Physical Activity: Not on file  Stress: Not on file  Social Connections: Not on file  Intimate Partner Violence: Not At Risk (12/03/2020)   Humiliation, Afraid, Rape, and Kick questionnaire    Fear of Current or Ex-Partner: No    Emotionally Abused: No    Physically Abused: No    Sexually Abused: No   Social History   Tobacco Use  Smoking Status Former   Types: Cigarettes  Smokeless Tobacco Never   Social History   Substance and Sexual Activity  Alcohol Use No    Family History:  Family History  Problem Relation Age of Onset   Asthma Mother    Depression Mother    Asthma Maternal Grandmother    Diabetes Maternal Grandmother    Hypertension Maternal Grandmother     Past medical history, surgical history, medications, allergies, family history and social history reviewed with patient today and changes made to appropriate areas of the chart.   Review of Systems  All other systems reviewed and are negative.  All other ROS negative except what is listed above and in the HPI.      Objective:    BP 121/81 (BP  Location: Left Arm, Patient Position: Sitting, Cuff Size: Large)   Pulse 80   Temp (!) 97.5 F (36.4 C) (Oral)   Ht 5' 0.5 (1.537 m)   Wt 205 lb 12.8 oz (93.4 kg)   LMP 01/19/2023 (Exact Date)   SpO2 98%   BMI 39.53 kg/m   Wt Readings from Last 3 Encounters:  03/21/23 205 lb 12.8 oz (93.4 kg)  03/02/23 199 lb 6.4 oz (90.4 kg)  02/28/23 199 lb 9.6 oz (90.5 kg)    Physical Exam Vitals and nursing note reviewed.  Constitutional:      General: She is awake. She is not in acute distress.    Appearance: Normal appearance. She is well-developed. She is obese. She is not ill-appearing.  HENT:     Head: Normocephalic and atraumatic.     Right Ear: Hearing, tympanic membrane, ear canal and external ear normal. No drainage.     Left Ear: Hearing, tympanic membrane, ear canal and external ear normal. No drainage.     Nose: Nose normal.     Right Sinus: No maxillary sinus tenderness or frontal sinus tenderness.     Left Sinus: No maxillary sinus tenderness or frontal sinus tenderness.     Mouth/Throat:     Mouth: Mucous membranes are moist.     Pharynx: Oropharynx is clear. Uvula midline. No pharyngeal swelling, oropharyngeal exudate or posterior oropharyngeal erythema.  Eyes:     General: Lids are normal.        Right eye: No discharge.        Left eye: No discharge.     Extraocular Movements: Extraocular movements intact.     Conjunctiva/sclera: Conjunctivae normal.     Pupils: Pupils are equal, round, and reactive to light.     Visual Fields: Right eye visual fields normal and left eye visual fields normal.  Neck:     Thyroid : No thyromegaly.     Vascular: No carotid bruit.     Trachea: Trachea normal.  Cardiovascular:     Rate and Rhythm: Normal rate and regular rhythm.     Heart sounds: Normal heart sounds. No murmur heard.    No gallop.  Pulmonary:     Effort: Pulmonary effort is normal. No accessory muscle usage or respiratory distress.     Breath sounds: Normal breath  sounds.  Chest:  Breasts:  Right: Normal.     Left: Normal.  Abdominal:     General: Bowel sounds are normal.     Palpations: Abdomen is soft. There is no hepatomegaly or splenomegaly.     Tenderness: There is no abdominal tenderness.  Musculoskeletal:        General: Normal range of motion.     Cervical back: Normal range of motion and neck supple.     Right lower leg: No edema.     Left lower leg: No edema.  Lymphadenopathy:     Head:     Right side of head: No submental, submandibular, tonsillar, preauricular or posterior auricular adenopathy.     Left side of head: No submental, submandibular, tonsillar, preauricular or posterior auricular adenopathy.     Cervical: No cervical adenopathy.     Upper Body:     Right upper body: No supraclavicular, axillary or pectoral adenopathy.     Left upper body: No supraclavicular, axillary or pectoral adenopathy.  Skin:    General: Skin is warm and dry.     Capillary Refill: Capillary refill takes less than 2 seconds.     Findings: No rash.  Neurological:     Mental Status: She is alert and oriented to person, place, and time.     Gait: Gait is intact.  Psychiatric:        Attention and Perception: Attention normal.        Mood and Affect: Mood normal.        Speech: Speech normal.        Behavior: Behavior normal. Behavior is cooperative.        Thought Content: Thought content normal.        Judgment: Judgment normal.     Results for orders placed or performed in visit on 03/02/23  Cervicovaginal ancillary only   Collection Time: 03/02/23  3:26 PM  Result Value Ref Range   Neisseria Gonorrhea Negative    Chlamydia Negative    Trichomonas Negative    Bacterial Vaginitis (gardnerella) Negative    Candida Vaginitis Negative    Candida Glabrata Negative    Comment Normal Reference Range Candida Species - Negative    Comment Normal Reference Range Candida Galbrata - Negative    Comment Normal Reference Range Trichomonas -  Negative    Comment Normal Reference Ranger Chlamydia - Negative    Comment      Normal Reference Range Neisseria Gonorrhea - Negative   Comment      Normal Reference Range Bacterial Vaginosis - Negative  POCT urine pregnancy   Collection Time: 03/02/23  3:32 PM  Result Value Ref Range   Preg Test, Ur Negative Negative      Assessment & Plan:   Problem List Items Addressed This Visit       Other   Obesity (BMI 30-39.9)   Recommended eating smaller high protein, low fat meals more frequently and exercising 30 mins a day 5 times a week with a goal of 10-15lb weight loss in the next 3 months.       Other Visit Diagnoses       Annual physical exam    -  Primary   Health maintenance reviewed during visit today.  Labs ordered.  Vaccines reviewed.  COVID shot given.   Relevant Orders   CBC with Differential/Platelet   Comprehensive metabolic panel   Lipid panel   TSH   Urinalysis, Routine w reflex microscopic     Screening for ischemic heart  disease       Relevant Orders   Lipid panel     Need for COVID-19 vaccine       Relevant Orders   Pfizer Comirnaty Covid -19 Vaccine 31yrs and older        Follow up plan: Return in about 6 months (around 09/18/2023) for Depression/Anxiety FU.   LABORATORY TESTING:  - Pap smear: not applicable  IMMUNIZATIONS:   - Tdap: Tetanus vaccination status reviewed: last tetanus booster within 10 years. - Influenza: Up to date - Pneumovax: Not applicable - Prevnar: Not applicable - COVID: Administered today - HPV: Up to date - Shingrix vaccine: Not applicable  SCREENING: -Mammogram: Not applicable  - Colonoscopy: Not applicable  - Bone Density: Not applicable  -Hearing Test: Not applicable  -Spirometry: Not applicable   PATIENT COUNSELING:   Advised to take 1 mg of folate supplement per day if capable of pregnancy.   Sexuality: Discussed sexually transmitted diseases, partner selection, use of condoms, avoidance of unintended  pregnancy  and contraceptive alternatives.   Advised to avoid cigarette smoking.  I discussed with the patient that most people either abstain from alcohol or drink within safe limits (<=14/week and <=4 drinks/occasion for males, <=7/weeks and <= 3 drinks/occasion for females) and that the risk for alcohol disorders and other health effects rises proportionally with the number of drinks per week and how often a drinker exceeds daily limits.  Discussed cessation/primary prevention of drug use and availability of treatment for abuse.   Diet: Encouraged to adjust caloric intake to maintain  or achieve ideal body weight, to reduce intake of dietary saturated fat and total fat, to limit sodium intake by avoiding high sodium foods and not adding table Ortega, and to maintain adequate dietary potassium and calcium preferably from fresh fruits, vegetables, and low-fat dairy products.    stressed the importance of regular exercise  Injury prevention: Discussed safety belts, safety helmets, smoke detector, smoking near bedding or upholstery.   Dental health: Discussed importance of regular tooth brushing, flossing, and dental visits.    NEXT PREVENTATIVE PHYSICAL DUE IN 1 YEAR. Return in about 6 months (around 09/18/2023) for Depression/Anxiety FU.

## 2023-03-22 LAB — COMPREHENSIVE METABOLIC PANEL
ALT: 20 [IU]/L (ref 0–32)
AST: 19 [IU]/L (ref 0–40)
Albumin: 4.5 g/dL (ref 4.0–5.0)
Alkaline Phosphatase: 102 [IU]/L (ref 42–106)
BUN/Creatinine Ratio: 21 (ref 9–23)
BUN: 15 mg/dL (ref 6–20)
Bilirubin Total: 0.2 mg/dL (ref 0.0–1.2)
CO2: 22 mmol/L (ref 20–29)
Calcium: 9.6 mg/dL (ref 8.7–10.2)
Chloride: 104 mmol/L (ref 96–106)
Creatinine, Ser: 0.73 mg/dL (ref 0.57–1.00)
Globulin, Total: 2.4 g/dL (ref 1.5–4.5)
Glucose: 81 mg/dL (ref 70–99)
Potassium: 4 mmol/L (ref 3.5–5.2)
Sodium: 141 mmol/L (ref 134–144)
Total Protein: 6.9 g/dL (ref 6.0–8.5)
eGFR: 121 mL/min/{1.73_m2} (ref >59)

## 2023-03-22 LAB — LIPID PANEL
Chol/HDL Ratio: 5.3 {ratio} — ABNORMAL HIGH (ref 0.0–4.4)
Cholesterol, Total: 189 mg/dL (ref 100–199)
HDL: 36 mg/dL — ABNORMAL LOW (ref >39)
LDL Chol Calc (NIH): 125 mg/dL — ABNORMAL HIGH (ref 0–99)
Triglycerides: 157 mg/dL — ABNORMAL HIGH (ref 0–149)
VLDL Cholesterol Cal: 28 mg/dL (ref 5–40)

## 2023-03-22 LAB — CBC WITH DIFFERENTIAL/PLATELET
Basophils Absolute: 0.1 10*3/uL (ref 0.0–0.2)
Basos: 1 %
EOS (ABSOLUTE): 0.2 10*3/uL (ref 0.0–0.4)
Eos: 2 %
Hematocrit: 46.7 % — ABNORMAL HIGH (ref 34.0–46.6)
Hemoglobin: 15.4 g/dL (ref 11.1–15.9)
Immature Grans (Abs): 0 10*3/uL (ref 0.0–0.1)
Immature Granulocytes: 0 %
Lymphocytes Absolute: 2.7 10*3/uL (ref 0.7–3.1)
Lymphs: 22 %
MCH: 30.6 pg (ref 26.6–33.0)
MCHC: 33 g/dL (ref 31.5–35.7)
MCV: 93 fL (ref 79–97)
Monocytes Absolute: 0.8 10*3/uL (ref 0.1–0.9)
Monocytes: 7 %
Neutrophils Absolute: 8.6 10*3/uL — ABNORMAL HIGH (ref 1.4–7.0)
Neutrophils: 68 %
Platelets: 365 10*3/uL (ref 150–450)
RBC: 5.03 x10E6/uL (ref 3.77–5.28)
RDW: 12.5 % (ref 11.7–15.4)
WBC: 12.5 10*3/uL — ABNORMAL HIGH (ref 3.4–10.8)

## 2023-03-22 LAB — TSH: TSH: 1.32 u[IU]/mL (ref 0.450–4.500)

## 2023-04-19 ENCOUNTER — Ambulatory Visit: Payer: Self-pay

## 2023-04-19 NOTE — Telephone Encounter (Signed)
 Message from [place] A sent at 04/19/2023  1:57 PM EST  Summary: Pap smear   Pt states that she just turned 23 on Sunday and is wanting to know when she needs to get a Pap smear. Pt states that she is not having any symptoms.  Please advise.         Chief Complaint:when to begin Pap smears Disposition: [] ED /[] Urgent Care (no appt availability in office) / [x] Appointment(In office/virtual)/ []  [facility]/ [] Home Care/ [] Refused Recommended Disposition /[]  Mobile Bus/ []  Follow-up with PCP Additional Notes: appt made   Reason for Disposition  Requesting regular office appointment  Answer Assessment - Initial Assessment Questions 1. REASON FOR CALL or QUESTION: What is your reason for calling today? or How can I best help you? or What question do you have that I can help answer?     Advised pt age 70 is the age ofr the first PAP smear- pt wanted appt  Protocols used: Information Only Call - No Triage-A-AH

## 2023-04-27 ENCOUNTER — Other Ambulatory Visit (HOSPITAL_COMMUNITY)
Admission: RE | Admit: 2023-04-27 | Discharge: 2023-04-27 | Disposition: A | Discharge status: Home / Self Care | Payer: Medicaid Other | Source: Ambulatory Visit | Attending: Nurse Practitioner | Admitting: Nurse Practitioner

## 2023-04-27 ENCOUNTER — Telehealth: Payer: Self-pay | Admitting: Nurse Practitioner

## 2023-04-27 ENCOUNTER — Ambulatory Visit (INDEPENDENT_AMBULATORY_CARE_PROVIDER_SITE_OTHER): Payer: Medicaid Other | Admitting: Nurse Practitioner

## 2023-04-27 ENCOUNTER — Encounter: Payer: Self-pay | Admitting: Nurse Practitioner

## 2023-04-27 VITALS — BP 121/70 | HR 78 | Ht 60.5 in | Wt 195.6 lb

## 2023-04-27 DIAGNOSIS — Z124 Encounter for screening for malignant neoplasm of cervix: Secondary | ICD-10-CM | POA: Diagnosis not present

## 2023-04-27 DIAGNOSIS — F325 Major depressive disorder, single episode, in full remission: Secondary | ICD-10-CM

## 2023-04-27 NOTE — Telephone Encounter (Signed)
Pts mother called and stated that daughter is having a hard time with depression. She stated that she has come off of her depression medication. She stated that she stays in the bed and doesn't want to come out. She is hoping that Dr Caren Griffins could just speak with her at there appt today.

## 2023-04-27 NOTE — Assessment & Plan Note (Signed)
Chronic.  Patient feels like she is managing well.  Denies concerns of depression during visit.  Offered to discuss medication anytime if patient feels like she needs it.  Follow up if symptoms worsen.

## 2023-04-27 NOTE — Progress Notes (Signed)
BP 121/70 (BP Location: Right Arm, Patient Position: Sitting, Cuff Size: Large)   Pulse 78   Ht 5' 0.5" (1.537 m)   Wt 195 lb 9.6 oz (88.7 kg)   SpO2 98%   BMI 37.57 kg/m    Subjective:    Patient ID: Veronica Ortega, female    DOB: [birth year], 23 y.o.   MRN: [id number]  HPI: Veronica Ortega is a 23 y.o. female  Chief Complaint  Patient presents with   PAP   Patient presents to clinic to have PAP completed.  Denies concerns regarding vaginal area at visit today.    DEPRESSION Patient's mom called to stated that patient has been having a hard time with her depression lately.  Discussed this with patient during visit.  Patient denies feelings of depression and hopelessness.   Denies feeling like she needs medication.  Denies SI.   Flowsheet Row Office Visit from 04/27/2023 in [facility]  PHQ-9 Total Score 0         04/27/2023    1:21 PM 03/21/2023    9:45 AM 02/28/2023    3:52 PM 12/29/2022    1:35 PM  GAD 7 : Generalized Anxiety Score  Nervous, Anxious, on Edge 0 0 0 0  Control/stop worrying 0 0 0 0  Worry too much - different things 0 0 0 1  Trouble relaxing 0 0 0 0  Restless 0 0 0 0  Easily annoyed or irritable 0 0 0 1  Afraid - awful might happen 0 0 0 0  Total GAD 7 Score 0 0 0 2  Anxiety Difficulty    Not difficult at all     Relevant past medical, surgical, family and social history reviewed and updated as indicated. Interim medical history since our last visit reviewed. Allergies and medications reviewed and updated.  Review of Systems  Psychiatric/Behavioral:  Negative for dysphoric mood and suicidal ideas. The patient is not nervous/anxious.     Per HPI unless specifically indicated above     Objective:    BP 121/70 (BP Location: Right Arm, Patient Position: Sitting, Cuff Size: Large)   Pulse 78   Ht 5' 0.5" (1.537 m)   Wt 195 lb 9.6 oz (88.7 kg)   SpO2 98%   BMI 37.57 kg/m   Wt Readings from Last 3 Encounters:  04/27/23  195 lb 9.6 oz (88.7 kg)  03/21/23 205 lb 12.8 oz (93.4 kg)  03/02/23 199 lb 6.4 oz (90.4 kg)    Physical Exam Vitals and nursing note reviewed. Exam conducted with a chaperone present Oswaldo Conroy, CMA).  Constitutional:      General: She is not in acute distress.    Appearance: Normal appearance. She is normal weight. She is not ill-appearing, toxic-appearing or diaphoretic.  HENT:     Head: Normocephalic.     Right Ear: External ear normal.     Left Ear: External ear normal.     Nose: Nose normal.     Mouth/Throat:     Mouth: Mucous membranes are moist.     Pharynx: Oropharynx is clear.  Eyes:     General:        Right eye: No discharge.        Left eye: No discharge.     Extraocular Movements: Extraocular movements intact.     Conjunctiva/sclera: Conjunctivae normal.     Pupils: Pupils are equal, round, and reactive to light.  Cardiovascular:     Rate and Rhythm:  Normal rate and regular rhythm.     Heart sounds: No murmur heard. Pulmonary:     Effort: Pulmonary effort is normal. No respiratory distress.     Breath sounds: Normal breath sounds. No wheezing or rales.  Musculoskeletal:     Cervical back: Normal range of motion and neck supple.  Skin:    General: Skin is warm and dry.     Capillary Refill: Capillary refill takes less than 2 seconds.  Neurological:     General: No focal deficit present.     Mental Status: She is alert and oriented to person, place, and time. Mental status is at baseline.  Psychiatric:        Mood and Affect: Mood normal.        Behavior: Behavior normal.        Thought Content: Thought content normal.        Judgment: Judgment normal.     Results for orders placed or performed in visit on 03/21/23  Microscopic Examination   Collection Time: 03/21/23  9:56 AM   Urine  Result Value Ref Range   WBC, UA 0-5 0 - 5 /hpf   RBC, Urine 11-30 (A) 0 - 2 /hpf   Epithelial Cells (non renal) 0-10 0 - 10 /hpf   Bacteria, UA Few None seen/Few   Urinalysis, Routine w reflex microscopic   Collection Time: 03/21/23  9:56 AM  Result Value Ref Range   Specific Gravity, UA >1.030 (H) 1.005 - 1.030   pH, UA 6.0 5.0 - 7.5   Color, UA Red (A) Yellow   Appearance Ur Cloudy (A) Clear   Leukocytes,UA Negative Negative   Protein,UA 1+ (A) Negative/Trace   Glucose, UA Negative Negative   Ketones, UA Negative Negative   RBC, UA 3+ (A) Negative   Bilirubin, UA Negative Negative   Urobilinogen, Ur 0.2 0.2 - 1.0 mg/dL   Nitrite, UA Negative Negative   Microscopic Examination See below:   CBC with Differential/Platelet   Collection Time: 03/21/23  9:57 AM  Result Value Ref Range   WBC 12.5 (H) 3.4 - 10.8 x10E3/uL   RBC 5.03 3.77 - 5.28 x10E6/uL   Hemoglobin 15.4 11.1 - 15.9 g/dL   Hematocrit 16.1 (H) 09.6 - 46.6 %   MCV 93 79 - 97 fL   MCH 30.6 26.6 - 33.0 pg   MCHC 33.0 31.5 - 35.7 g/dL   RDW 04.5 40.9 - 81.1 %   Platelets 365 150 - 450 x10E3/uL   Neutrophils 68 Not Estab. %   Lymphs 22 Not Estab. %   Monocytes 7 Not Estab. %   Eos 2 Not Estab. %   Basos 1 Not Estab. %   Neutrophils Absolute 8.6 (H) 1.4 - 7.0 x10E3/uL   Lymphocytes Absolute 2.7 0.7 - 3.1 x10E3/uL   Monocytes Absolute 0.8 0.1 - 0.9 x10E3/uL   EOS (ABSOLUTE) 0.2 0.0 - 0.4 x10E3/uL   Basophils Absolute 0.1 0.0 - 0.2 x10E3/uL   Immature Granulocytes 0 Not Estab. %   Immature Grans (Abs) 0.0 0.0 - 0.1 x10E3/uL  Comprehensive metabolic panel   Collection Time: 03/21/23  9:57 AM  Result Value Ref Range   Glucose 81 70 - 99 mg/dL   BUN 15 6 - 20 mg/dL   Creatinine, Ser 9.14 0.57 - 1.00 mg/dL   eGFR 782 >95 AO/ZHY/8.65   BUN/Creatinine Ratio 21 9 - 23   Sodium 141 134 - 144 mmol/L   Potassium 4.0 3.5 - 5.2 mmol/L  Chloride 104 96 - 106 mmol/L   CO2 22 20 - 29 mmol/L   Calcium 9.6 8.7 - 10.2 mg/dL   Total Protein 6.9 6.0 - 8.5 g/dL   Albumin 4.5 4.0 - 5.0 g/dL   Globulin, Total 2.4 1.5 - 4.5 g/dL   Bilirubin Total 0.2 0.0 - 1.2 mg/dL   Alkaline Phosphatase  102 42 - 106 IU/L   AST 19 0 - 40 IU/L   ALT 20 0 - 32 IU/L  Lipid panel   Collection Time: 03/21/23  9:57 AM  Result Value Ref Range   Cholesterol, Total 189 100 - 199 mg/dL   Triglycerides 098 (H) 0 - 149 mg/dL   HDL 36 (L) >11 mg/dL   VLDL Cholesterol Cal 28 5 - 40 mg/dL   LDL Chol Calc (NIH) 914 (H) 0 - 99 mg/dL   Chol/HDL Ratio 5.3 (H) 0.0 - 4.4 ratio  TSH   Collection Time: 03/21/23  9:57 AM  Result Value Ref Range   TSH 1.320 0.450 - 4.500 uIU/mL      Assessment & Plan:   Problem List Items Addressed This Visit       Other   Depression, major, single episode, complete remission (HCC)   Chronic.  Patient feels like she is managing well.  Denies concerns of depression during visit.  Offered to discuss medication anytime if patient feels like she needs it.  Follow up if symptoms worsen.      Other Visit Diagnoses       Screening for cervical cancer    -  Primary   Relevant Orders   Cytology - PAP        Follow up plan: No follow-ups on file.

## 2023-04-29 LAB — CYTOLOGY - PAP
Chlamydia: NEGATIVE
Comment: NEGATIVE
Comment: NEGATIVE
Comment: NORMAL
Diagnosis: NEGATIVE
Neisseria Gonorrhea: NEGATIVE
Trichomonas: NEGATIVE

## 2023-05-02 ENCOUNTER — Encounter: Payer: Self-pay | Admitting: Nurse Practitioner

## 2023-09-22 ENCOUNTER — Encounter: Payer: Self-pay | Admitting: Nurse Practitioner

## 2023-09-22 ENCOUNTER — Ambulatory Visit (INDEPENDENT_AMBULATORY_CARE_PROVIDER_SITE_OTHER): Payer: Self-pay | Admitting: Nurse Practitioner

## 2023-09-22 VITALS — BP 131/81 | HR 82 | Temp 98.1°F | Ht 60.5 in | Wt 189.8 lb

## 2023-09-22 DIAGNOSIS — F325 Major depressive disorder, single episode, in full remission: Secondary | ICD-10-CM | POA: Diagnosis not present

## 2023-09-22 DIAGNOSIS — L0292 Furuncle, unspecified: Secondary | ICD-10-CM

## 2023-09-22 MED ORDER — DOXYCYCLINE HYCLATE 100 MG PO TABS: 100.0000 mg | ORAL_TABLET | Freq: Two times a day (BID) | ORAL | 0 refills | Status: DC

## 2023-09-22 NOTE — Progress Notes (Signed)
 BP 131/81   Pulse 82   Temp 98.1 F (36.7 C) (Oral)   Ht 5' 0.5 (1.537 m)   Wt 189 lb 12.8 oz (86.1 kg)   SpO2 97%   BMI 36.46 kg/m    Subjective:    Patient ID: Veronica Ortega, female    DOB: Nov 17, 2000, 23 y.o.   MRN: [id number]  HPI: Veronica Ortega is a 23 y.o. female  Chief Complaint  Patient presents with   Depression   Anxiety   Blister    On inside of thigh and outside of vaginal area. Sore to touch.   Patient presents to clinic to have PAP completed.  Denies concerns regarding vaginal area at visit today.    DEPRESSION Patient states she is doing well without medication.  She does not feel like she is having any problems with depression or anxiety.  Feels like she is managing well.  Patient denies feelings of depression and hopelessness.   Denies feeling like she needs medication.  Denies SI.      09/22/2023    9:04 AM 04/27/2023    1:21 PM 03/21/2023    9:45 AM  PHQ9 SCORE ONLY  PHQ-9 Total Score 0 0 0        09/22/2023    9:04 AM 04/27/2023    1:21 PM 03/21/2023    9:45 AM 02/28/2023    3:52 PM  GAD 7 : Generalized Anxiety Score  Nervous, Anxious, on Edge 0 0 0 0  Control/stop worrying 0 0 0 0  Worry too much - different things 0 0 0 0  Trouble relaxing 0 0 0 0  Restless 0 0 0 0  Easily annoyed or irritable 0 0 0 0  Afraid - awful might happen 0 0 0 0  Total GAD 7 Score 0 0 0 0  Anxiety Difficulty Not difficult at all      Patient states she had a blister on her thigh on she had about a month ago.  She found one on her vagina two days ago.   Relevant past medical, surgical, family and social history reviewed and updated as indicated. Interim medical history since our last visit reviewed. Allergies and medications reviewed and updated.  Review of Systems  Skin:        Boil on vagina  Psychiatric/Behavioral:  Negative for dysphoric mood and suicidal ideas. The patient is not nervous/anxious.     Per HPI unless specifically indicated above      Objective:    BP 131/81   Pulse 82   Temp 98.1 F (36.7 C) (Oral)   Ht 5' 0.5 (1.537 m)   Wt 189 lb 12.8 oz (86.1 kg)   SpO2 97%   BMI 36.46 kg/m   Wt Readings from Last 3 Encounters:  09/22/23 189 lb 12.8 oz (86.1 kg)  04/27/23 195 lb 9.6 oz (88.7 kg)  03/21/23 205 lb 12.8 oz (93.4 kg)    Physical Exam Vitals and nursing note reviewed. Exam conducted with a chaperone present Montie Edison, CMA).  Constitutional:      General: She is not in acute distress.    Appearance: Normal appearance. She is normal weight. She is not ill-appearing, toxic-appearing or diaphoretic.  HENT:     Head: Normocephalic.     Right Ear: External ear normal.     Left Ear: External ear normal.     Nose: Nose normal.     Mouth/Throat:     Mouth: Mucous membranes are moist.  Pharynx: Oropharynx is clear.  Eyes:     General:        Right eye: No discharge.        Left eye: No discharge.     Extraocular Movements: Extraocular movements intact.     Conjunctiva/sclera: Conjunctivae normal.     Pupils: Pupils are equal, round, and reactive to light.  Cardiovascular:     Rate and Rhythm: Normal rate and regular rhythm.     Heart sounds: No murmur heard. Pulmonary:     Effort: Pulmonary effort is normal. No respiratory distress.     Breath sounds: Normal breath sounds. No wheezing or rales.  Genitourinary:  Musculoskeletal:     Cervical back: Normal range of motion and neck supple.  Skin:    General: Skin is warm and dry.     Capillary Refill: Capillary refill takes less than 2 seconds.  Neurological:     General: No focal deficit present.     Mental Status: She is alert and oriented to person, place, and time. Mental status is at baseline.  Psychiatric:        Mood and Affect: Mood normal.        Behavior: Behavior normal.        Thought Content: Thought content normal.        Judgment: Judgment normal.     Results for orders placed or performed in visit on 04/27/23  Cytology -  PAP   Collection Time: 04/27/23  1:39 PM  Result Value Ref Range   Neisseria Gonorrhea Negative    Chlamydia Negative    Trichomonas Negative    Adequacy      Satisfactory for evaluation; transformation zone component PRESENT.   Diagnosis      - Negative for intraepithelial lesion or malignancy (NILM)   Comment Normal Reference Ranger Chlamydia - Negative    Comment      Normal Reference Range Neisseria Gonorrhea - Negative   Comment Normal Reference Range Trichomonas - Negative       Assessment & Plan:   Problem List Items Addressed This Visit       Other   Depression, major, single episode, complete remission (HCC) - Primary   Chronic.  Patient feels like she is managing well.  Denies concerns of depression during visit.  Offered to discuss medication anytime if patient feels like she needs it.  Declined wanting to see a therapist.  Denies SI.  Follow up in 6 months.  Call sooner if concerns arise.       Other Visit Diagnoses       Boil       Will treat with Doxycyline.  Complete course of medication.  Follow up if not improved.        Follow up plan: Return in about 6 months (around 03/24/2024) for Depression/Anxiety FU.

## 2023-09-22 NOTE — Assessment & Plan Note (Signed)
 Chronic.  Patient feels like she is managing well.  Denies concerns of depression during visit.  Offered to discuss medication anytime if patient feels like she needs it.  Declined wanting to see a therapist.  Denies SI.  Follow up in 6 months.  Call sooner if concerns arise.

## 2023-10-25 ENCOUNTER — Ambulatory Visit
Admission: RE | Admit: 2023-10-25 | Discharge: 2023-10-25 | Disposition: A | Discharge status: Home / Self Care | Payer: Self-pay | Source: Ambulatory Visit

## 2023-10-25 ENCOUNTER — Ambulatory Visit: Payer: Self-pay | Admitting: Physician Assistant

## 2023-10-25 ENCOUNTER — Ambulatory Visit (INDEPENDENT_AMBULATORY_CARE_PROVIDER_SITE_OTHER)

## 2023-10-25 VITALS — BP 136/84 | HR 102 | Temp 98.6°F | Resp 16 | Wt 197.0 lb

## 2023-10-25 DIAGNOSIS — S92534A Nondisplaced fracture of distal phalanx of right lesser toe(s), initial encounter for closed fracture: Secondary | ICD-10-CM | POA: Diagnosis not present

## 2023-10-25 DIAGNOSIS — S92531A Displaced fracture of distal phalanx of right lesser toe(s), initial encounter for closed fracture: Secondary | ICD-10-CM | POA: Diagnosis not present

## 2023-10-25 DIAGNOSIS — M79674 Pain in right toe(s): Secondary | ICD-10-CM

## 2023-10-25 MED ORDER — IBUPROFEN 600 MG PO TABS: 600.0000 mg | ORAL_TABLET | Freq: Four times a day (QID) | ORAL | 0 refills | Status: AC | PRN

## 2023-10-25 NOTE — ED Triage Notes (Signed)
 Pt went swimming [place] a river 2 days ago and hit her right middle toe (3rd) on a rock. Her toe is bruised and swollen.

## 2023-10-25 NOTE — Discharge Instructions (Addendum)
-   You have a chip fracture of your toe.  This will heal in a couple weeks. - Ice the foot and elevate it to help with swelling.  Also take ibuprofen . - We buddy taped the toes together and provided you with a postop shoe and crutches.  You may need to be nonweightbearing for the next week or so but should be feeling a little better to put weight on after that.  However, if you feel that your symptoms are not improving over the next week and a half or so you should consider following up with the [facility].

## 2023-10-25 NOTE — ED Provider Notes (Signed)
 [facility]    CSN: [id number] Arrival date & time: 10/25/23  1308      History   Chief Complaint Chief Complaint  Patient presents with   Toe Injury    I hit my toe on a rock while swimming, it is bruised really bad and hurts to walk. - Entered by patient    HPI Tenasia Aull is a 23 y.o. female presenting for pain, bruising and swelling of the right third toe for the past couple days.  Patient reports that she stubbed it on a rock while swimming.  Reports increased pain when trying to bear weight and moving the toe.  Has been taking Tylenol and icing the toe.  Patient concern for possible fracture.  No numbness or weakness.  No other injuries.  HPI  Past Medical History:  Diagnosis Date   Asthma    Nexplanon  in place 09/28/2022    Patient Active Problem List   Diagnosis Date Noted   Obesity (BMI 30-39.9) 03/21/2023   IUD (intrauterine device) in place 01/20/2023   Boil, breast 09/28/2022   Asthma    Depression, major, single episode, complete remission (HCC)    Seasonal allergic rhinitis due to pollen 01/03/2015    Past Surgical History:  Procedure Laterality Date   Addenoids Removed     TONSILLECTOMY     TYMPANOSTOMY TUBE PLACEMENT      OB History     Gravida  0   Para  0   Term  0   Preterm  0   AB  0   Living  0      SAB  0   IAB  0   Ectopic  0   Multiple  0   Live Births  0            Home Medications    Prior to Admission medications   Medication Sig Start Date End Date Taking? Authorizing Provider  ibuprofen  (ADVIL ) 600 MG tablet Take 1 tablet (600 mg total) by mouth every 6 (six) hours as needed. 10/25/23  Yes Arvis Huxley B, PA-C  paragard  intrauterine copper  IUD IUD 1 each by Intrauterine route once.   Yes [provider]  Albuterol Sulfate (PROAIR RESPICLICK) 108 (90 Base) MCG/ACT AEPB Inhale into the lungs.    [provider]  doxycycline  (VIBRA -TABS) 100 MG tablet Take 1 tablet (100 mg  total) by mouth 2 (two) times daily. 09/22/23   Melvin Pao, NP    Family History Family History  Problem Relation Age of Onset   Asthma Mother    Depression Mother    Asthma Maternal Grandmother    Diabetes Maternal Grandmother    Hypertension Maternal Grandmother     Social History Social History   Tobacco Use   Smoking status: Former    Types: Cigarettes   Smokeless tobacco: Never  Vaping Use   Vaping status: Former  Substance Use Topics   Alcohol use: Not Currently   Drug use: Not Currently    Types: Marijuana     Allergies   Patient has no known allergies.   Review of Systems Review of Systems  Musculoskeletal:  Positive for arthralgias and joint swelling.  Skin:  Positive for color change. Negative for wound.  Neurological:  Negative for weakness and numbness.     Physical Exam Triage Vital Signs ED Triage Vitals  Encounter Vitals Group     BP 10/25/23 1320 136/84     Girls Systolic BP Percentile --  Girls Diastolic BP Percentile --      Boys Systolic BP Percentile --      Boys Diastolic BP Percentile --      Pulse Rate 10/25/23 1320 (!) 102     Resp 10/25/23 1320 16     Temp 10/25/23 1320 98.6 F (37 C)     Temp Source 10/25/23 1320 Oral     SpO2 10/25/23 1320 96 %     Weight 10/25/23 1318 197 lb (89.4 kg)     Height --      Head Circumference --      Peak Flow --      Pain Score 10/25/23 1318 10     Pain Loc --      Pain Education --      Exclude from Growth Chart --    No data found.  Updated Vital Signs BP 136/84 (BP Location: Right Arm)   Pulse (!) 102   Temp 98.6 F (37 C) (Oral)   Resp 16   Wt 197 lb (89.4 kg)   LMP 10/14/2023   SpO2 96%   BMI 37.84 kg/m      Physical Exam Vitals and nursing note reviewed.  Constitutional:      General: She is not in acute distress.    Appearance: Normal appearance. She is not ill-appearing or toxic-appearing.  HENT:     Head: Normocephalic and atraumatic.  Eyes:      General: No scleral icterus.       Right eye: No discharge.        Left eye: No discharge.     Conjunctiva/sclera: Conjunctivae normal.  Cardiovascular:     Rate and Rhythm: Normal rate.     Pulses: Normal pulses.  Pulmonary:     Effort: Pulmonary effort is normal. No respiratory distress.  Musculoskeletal:     Cervical back: Neck supple.     Comments: RIGHT 3RD TOE: There is mild/moderate contusion and swelling of the toe. TTP middle phalanx and distal phalanx. Painful ROM. Good pulses.  Skin:    General: Skin is dry.  Neurological:     General: No focal deficit present.     Mental Status: She is alert. Mental status is at baseline.     Motor: No weakness.     Gait: Gait abnormal.  Psychiatric:        Mood and Affect: Mood normal.        Behavior: Behavior normal.      UC Treatments / Results  Labs (all labs ordered are listed, but only abnormal results are displayed) Labs Reviewed - No data to display  EKG   Radiology DG Toe 3rd Right Result Date: 10/25/2023 CLINICAL DATA:  Pain after hitting toe on rock while swimming. EXAM: RIGHT THIRD TOE COMPARISON:  None Available. FINDINGS: Minimally displaced fracture of the distal phalanx involving the articular surface at the dorsal aspect. Proximal digit is intact. There is mild soft tissue edema. IMPRESSION: Minimally displaced fracture of the distal phalanx involving the dorsal articular surface. Electronically Signed   By: Andrea Gasman M.D.   On: 10/25/2023 14:38    Procedures Procedures (including critical care time)  Medications Ordered in UC Medications - No data to display  Initial Impression / Assessment and Plan / UC Course  I have reviewed the triage vital signs and the nursing notes.  Pertinent labs & imaging results that were available during my care of the patient were reviewed by me and considered in my  medical decision making (see chart for details).   23 year old female presents for pain, swelling and  bruising of the right third toe after stubbing it on a rock a couple days ago while swimming.  X-ray obtained today shows concern for avulsion fracture of the distal phalanx of the right third toe.  Reviewed this with patient.  Her toe was buddy taped and she was provided a postop shoe and crutches.  Reviewed fracture care guidelines.  Discussed avoiding painful activities.  Reviewed following up with [facility] if not improving over the next couple weeks or if symptoms worsen.  X-ray over read confirms minimally displaced fracture of distal phalanx. Reviewed. No change to treatment plan.   Final Clinical Impressions(s) / UC Diagnoses   Final diagnoses:  Pain of toe of right foot  Closed nondisplaced fracture of distal phalanx of lesser toe of right foot, initial encounter     Discharge Instructions      - You have a chip fracture of your toe.  This will heal in a couple weeks. - Ice the foot and elevate it to help with swelling.  Also take ibuprofen . - We buddy taped the toes together and provided you with a postop shoe and crutches.  You may need to be nonweightbearing for the next week or so but should be feeling a little better to put weight on after that.  However, if you feel that your symptoms are not improving over the next week and a half or so you should consider following up with the [facility].    ED Prescriptions     Medication Sig Dispense Auth. Provider   ibuprofen  (ADVIL ) 600 MG tablet Take 1 tablet (600 mg total) by mouth every 6 (six) hours as needed. 30 tablet Jesusita Jocelyn B, PA-C      PDMP not reviewed this encounter.   Arvis Jolan NOVAK, PA-C 10/25/23 1453

## 2023-11-08 DIAGNOSIS — H52223 Regular astigmatism, bilateral: Secondary | ICD-10-CM | POA: Diagnosis not present

## 2023-11-17 ENCOUNTER — Ambulatory Visit (INDEPENDENT_AMBULATORY_CARE_PROVIDER_SITE_OTHER): Admitting: Nurse Practitioner

## 2023-11-17 ENCOUNTER — Encounter: Payer: Self-pay | Admitting: Nurse Practitioner

## 2023-11-17 VITALS — BP 126/82 | HR 102 | Temp 98.9°F | Ht 60.5 in | Wt 192.2 lb

## 2023-11-17 DIAGNOSIS — R948 Abnormal results of function studies of other organs and systems: Secondary | ICD-10-CM | POA: Diagnosis not present

## 2023-11-17 DIAGNOSIS — L732 Hidradenitis suppurativa: Secondary | ICD-10-CM

## 2023-11-17 NOTE — Progress Notes (Signed)
 BP 126/82   Pulse (!) 102   Temp 98.9 F (37.2 C) (Oral)   Ht 5' 0.5 (1.537 m)   Wt 192 lb 3.2 oz (87.2 kg)   LMP 10/14/2023   SpO2 98%   BMI 36.92 kg/m    Subjective:    Patient ID: Veronica Ortega, female    DOB: May 09, 2000, 23 y.o.   MRN: [id number]  HPI: Veronica Ortega is a 23 y.o. female  Chief Complaint  Patient presents with   Cyst    Patient states she has a bump on her vagina, states she has had it before. States it has not drained and is not very painful.    Labs Only    Patient states she would like to have her thyroid  checked   Patient states she has a bump on her vagina.  States it got smaller after taking antibiotics.  The bump is still there and didn't go away completely.  No drainage, pain or redness.   Patient states she would like to have her thyroid  checked.  Her mom feels like it may have something to do with her metabolism.  Denies weight gain, fatigue, constipation.    Relevant past medical, surgical, family and social history reviewed and updated as indicated. Interim medical history since our last visit reviewed. Allergies and medications reviewed and updated.  Review of Systems  Constitutional:  Negative for fatigue and unexpected weight change.  Gastrointestinal:  Negative for constipation.  Skin:        Vaginal bump    Per HPI unless specifically indicated above     Objective:    BP 126/82   Pulse (!) 102   Temp 98.9 F (37.2 C) (Oral)   Ht 5' 0.5 (1.537 m)   Wt 192 lb 3.2 oz (87.2 kg)   LMP 10/14/2023   SpO2 98%   BMI 36.92 kg/m   Wt Readings from Last 3 Encounters:  11/17/23 192 lb 3.2 oz (87.2 kg)  10/25/23 197 lb (89.4 kg)  09/22/23 189 lb 12.8 oz (86.1 kg)    Physical Exam Vitals and nursing note reviewed.  Constitutional:      General: She is not in acute distress.    Appearance: Normal appearance. She is normal weight. She is not ill-appearing, toxic-appearing or diaphoretic.  HENT:     Head: Normocephalic.     Right  Ear: External ear normal.     Left Ear: External ear normal.     Nose: Nose normal.     Mouth/Throat:     Mouth: Mucous membranes are moist.     Pharynx: Oropharynx is clear.  Eyes:     General:        Right eye: No discharge.        Left eye: No discharge.     Extraocular Movements: Extraocular movements intact.     Conjunctiva/sclera: Conjunctivae normal.     Pupils: Pupils are equal, round, and reactive to light.  Cardiovascular:     Rate and Rhythm: Normal rate and regular rhythm.     Heart sounds: No murmur heard. Pulmonary:     Effort: Pulmonary effort is normal. No respiratory distress.     Breath sounds: Normal breath sounds. No wheezing or rales.  Genitourinary:  Musculoskeletal:     Cervical back: Normal range of motion and neck supple.  Skin:    General: Skin is warm and dry.     Capillary Refill: Capillary refill takes less than 2 seconds.  Neurological:  General: No focal deficit present.     Mental Status: She is alert and oriented to person, place, and time. Mental status is at baseline.  Psychiatric:        Mood and Affect: Mood normal.        Behavior: Behavior normal.        Thought Content: Thought content normal.        Judgment: Judgment normal.     Results for orders placed or performed in visit on 04/27/23  Cytology - PAP   Collection Time: 04/27/23  1:39 PM  Result Value Ref Range   Neisseria Gonorrhea Negative    Chlamydia Negative    Trichomonas Negative    Adequacy      Satisfactory for evaluation; transformation zone component PRESENT.   Diagnosis      - Negative for intraepithelial lesion or malignancy (NILM)   Comment Normal Reference Ranger Chlamydia - Negative    Comment      Normal Reference Range Neisseria Gonorrhea - Negative   Comment Normal Reference Range Trichomonas - Negative       Assessment & Plan:   Problem List Items Addressed This Visit   None Visit Diagnoses       Hidradenitis    -  Primary   No infection  noted on exam today. Will refer to Dermatology for recurrent boils.   Relevant Orders   Ambulatory referral to Dermatology     Abnormal metabolism       Will check thyroid  labs at visit today.  Will make recommendations based on results.   Relevant Orders   TSH   T4, free        Follow up plan: No follow-ups on file.

## 2023-11-18 ENCOUNTER — Ambulatory Visit: Payer: Self-pay | Admitting: Nurse Practitioner

## 2023-11-18 LAB — T4, FREE: Free T4: 1.18 ng/dL (ref 0.82–1.77)

## 2023-11-18 LAB — TSH: TSH: 2.16 u[IU]/mL (ref 0.450–4.500)

## 2024-01-05 ENCOUNTER — Ambulatory Visit

## 2024-01-23 ENCOUNTER — Ambulatory Visit

## 2024-02-20 ENCOUNTER — Ambulatory Visit: Payer: Self-pay

## 2024-02-20 DIAGNOSIS — L732 Hidradenitis suppurativa: Secondary | ICD-10-CM

## 2024-02-20 MED ORDER — CLINDAMYCIN PHOSPHATE 1 % EX SWAB: CUTANEOUS | 5 refills | Status: AC

## 2024-02-20 NOTE — Progress Notes (Signed)
    Subjective   Veronica Ortega is a 23 y.o. female who presents for the following: Rash. Patient is new patient.  Today patient reports: Patient here for some red bumps at inframammary and inner thighs some present for months. Patient does apply powder after showering to avoid skin to skin contact.   This patient is accompanied in the office by her boyfriend.   Review of Systems:    No other skin or systemic complaints except as noted in HPI or Assessment and Plan.  The following portions of the chart were reviewed this encounter and updated as appropriate: medications, allergies, medical history  Relevant Medical History:  n/a   Objective  (SKPE) Well appearing patient in no apparent distress; mood and affect are within normal limits. Examination was performed of the: Focused Exam of: Inframammary, bilateral thighs   Examination notable for: Scarred papules under right breast and left groin.  Examination limited by: Undergarments, Shoes or socks , and Clothing     Assessment & Plan  (SKAP)   Hidradenitis suppurativa, Hurley stage 1, mild Chronic and persistent condition with duration or expected duration over one year. Condition is symptomatic and bothersome to patient. Patient is flaring and not currently at treatment goal.  - Discussed the chronic, relapsing nature of this skin disease, characterized by recurring inflamed painful nodules with abscess and sinus formation, and scarring - Explained to patient that early, isolated lesions can remit with medical treatment, but once sinus tracts are established treatment options are limited and surgery is the only definitive treatment; however, recurrence rate can be up to 25% even with surgery -Start topical clindamycin  swabs 1% once daily to active areas  - Start OTC benzoyl wash use daily, let sit 5 minutes then rinse off. Can bleach clothing.  - Advised patient that there are injectable options if topicals are not helping flares.     Level of service outlined above   Patient instructions (SKPI)   Procedures, orders, diagnosis for this visit:    There are no diagnoses linked to this encounter.  Return to clinic: Return in about 3 months (around 05/20/2024) for HS , w/ Dr. Raymund.  I, Jacquelynn V. Wilfred, CMA, am acting as scribe for Lauraine JAYSON Raymund, MD.  Documentation: I have reviewed the above documentation for accuracy and completeness, and I agree with the above.  Lauraine JAYSON Raymund, MD

## 2024-02-20 NOTE — Patient Instructions (Addendum)
 Start OTC Benzoyl peroxide wash 3-5% (brand name includes Neutragena Clear Pore, CereVe Acne Foaming Cream Cleanser, Differin Cleanser) that you use in the shower. Can bleach linens (clothes, towels, etc), will NOT bleach your skin or hair). Dry off with a white towel so it doesn't take the color out of clothing and colored towels. Use daily, let sit 5 minutes then rinse out.      - Start topical clindamycin  swabs 1% once daily to active areas.    Due to recent changes in healthcare laws, you may see results of your pathology and/or laboratory studies on MyChart before the doctors have had a chance to review them. We understand that in some cases there may be results that are confusing or concerning to you. Please understand that not all results are received at the same time and often the doctors may need to interpret multiple results in order to provide you with the best plan of care or course of treatment. Therefore, we ask that you please give us  2 business days to thoroughly review all your results before contacting the office for clarification. Should we see a critical lab result, you will be contacted sooner.   If You Need Anything After Your Visit  If you have any questions or concerns for your doctor, please call our main line at [phone] and press option 4 to reach your doctor's medical assistant. If no one answers, please leave a voicemail as directed and we will return your call as soon as possible. Messages left after 4 pm will be answered the following business day.   You may also send us  a message via MyChart. We typically respond to MyChart messages within 1-2 business days.  For prescription refills, please ask your pharmacy to contact our office. Our fax number is [phone].  If you have an urgent issue when the clinic is closed that cannot wait until the next business day, you can page your doctor at the number below.    Please note that while we do our best to be  available for urgent issues outside of office hours, we are not available 24/7.   If you have an urgent issue and are unable to reach us , you may choose to seek medical care at your doctor's office, retail clinic, urgent care center, or emergency room.  If you have a medical emergency, please immediately call 911 or go to the emergency department.  Pager Numbers  - Dr. Hester: [phone]  - Dr. Jackquline: [phone]  - Dr. Claudene: [phone]   In the event of inclement weather, please call our main line at [phone] for an update on the status of any delays or closures.  Dermatology Medication Tips: Please keep the boxes that topical medications come in in order to help keep track of the instructions about where and how to use these. Pharmacies typically print the medication instructions only on the boxes and not directly on the medication tubes.   If your medication is too expensive, please contact our office at [phone] option 4 or send us  a message through MyChart.   We are unable to tell what your co-pay for medications will be in advance as this is different depending on your insurance coverage. However, we may be able to find a substitute medication at lower cost or fill out paperwork to get insurance to cover a needed medication.   If a prior authorization is required to get your medication covered by your insurance company, please allow us  1-2 business  days to complete this process.  Drug prices often vary depending on where the prescription is filled and some pharmacies may offer cheaper prices.  The website www.goodrx.com contains coupons for medications through different pharmacies. The prices here do not account for what the cost may be with help from insurance (it may be cheaper with your insurance), but the website can give you the price if you did not use any insurance.  - You can print the associated coupon and take it with your prescription to the pharmacy.   - You may also stop by our office during regular business hours and pick up a GoodRx coupon card.  - If you need your prescription sent electronically to a different pharmacy, notify our office through [facility] or by phone at [phone] option 4.     Si Usted Necesita Algo Despus de Su Visita  Tambin puede enviarnos un mensaje a travs de Clinical Cytogeneticist. Por lo general respondemos a los mensajes de MyChart en el transcurso de 1 a 2 das hbiles.  Para renovar recetas, por favor pida a su farmacia que se ponga en contacto con nuestra oficina. [place]i [place] de fax es [place] [phone].  Si tiene un asunto urgente cuando la clnica est cerrada y que no puede esperar hasta el siguiente da hbil, puede llamar/localizar a su doctor(a) al nmero que aparece a continuacin.   Por favor, tenga en cuenta que aunque hacemos todo lo posible para estar disponibles para asuntos urgentes fuera del horario de [place], no estamos disponibles las 24 horas del da, los 7 [address]  [address] de la [place].   Si tiene un problema urgente y no puede comunicarse con nosotros, puede optar por buscar atencin mdica  en el consultorio de su doctor(a), en una clnica privada, en un centro de atencin urgente o en una sala de emergencias.  Si tiene engineer, drilling, por favor llame inmediatamente al 911 o vaya a la sala de emergencias.  Nmeros de bper  - Dr. Hester: [phone]  - Dra. Jackquline: [phone]  - Dr. Claudene: [phone]   En caso de inclemencias del tiempo, por favor llame a landry capes principal al [phone] para una actualizacin sobre el [place] de cualquier retraso o cierre.  Consejos para la medicacin en dermatologa: Por favor, guarde las cajas en las que vienen los medicamentos de uso tpico para ayudarle a seguir las instrucciones sobre dnde y cmo usarlos. Las farmacias generalmente imprimen las instrucciones del medicamento slo en las cajas y no directamente en los tubos del  [place].   Si su medicamento es muy caro, por favor, pngase en contacto con landry rieger llamando al [phone] y presione la opcin 4 o envenos un mensaje a travs de Clinical Cytogeneticist.   No podemos decirle cul ser su copago por los medicamentos por adelantado ya que esto es diferente dependiendo de la cobertura de su seguro. Sin embargo, es posible que podamos encontrar un medicamento sustituto a audiological scientist un formulario para que el seguro cubra el medicamento que se considera necesario.   Si se requiere una autorizacin previa para que su compaa de seguros cubra su medicamento, por favor permtanos de 1 a 2 das hbiles para completar este proceso.  Los precios de los medicamentos varan con frecuencia dependiendo del environmental consultant de dnde se surte la receta y alguna farmacias pueden ofrecer precios ms baratos.  El sitio web www.goodrx.com tiene cupones para medicamentos de health and safety inspector. Los precios aqu no tienen en cuenta lo que podra costar con  la ayuda del seguro (puede ser ms barato con su seguro), pero el sitio web puede darle el precio si no visual merchandiser.  - Puede imprimir el cupn correspondiente y llevarlo con su receta a la farmacia.  - Tambin puede pasar por nuestra oficina durante el horario de atencin regular y education officer, museum una tarjeta de cupones de GoodRx.  - Si necesita que su receta se enve electrnicamente a una farmacia diferente, informe a nuestra oficina a travs de MyChart de [place] o por telfono llamando al [phone] y presione la opcin 4.

## 2024-03-26 ENCOUNTER — Encounter: Payer: Self-pay | Admitting: Nurse Practitioner

## 2024-03-26 ENCOUNTER — Ambulatory Visit: Admitting: Nurse Practitioner

## 2024-03-26 VITALS — BP 116/75 | HR 68 | Temp 97.9°F | Ht 60.51 in | Wt 186.8 lb

## 2024-03-26 DIAGNOSIS — F325 Major depressive disorder, single episode, in full remission: Secondary | ICD-10-CM

## 2024-03-26 DIAGNOSIS — Z23 Encounter for immunization: Secondary | ICD-10-CM | POA: Diagnosis not present

## 2024-03-26 NOTE — Progress Notes (Signed)
 "  BP 116/75 (BP Location: Right Arm, Patient Position: Sitting, Cuff Size: Normal)   Pulse 68   Temp 97.9 F (36.6 C) (Oral)   Ht 5' 0.51 (1.537 m)   Wt 186 lb 12.8 oz (84.7 kg)   LMP 03/26/2024 (Exact Date)   SpO2 99%   BMI 35.87 kg/m    Subjective:    Patient ID: Veronica Ortega, female    DOB: Dec 29, 2000, 24 y.o.   MRN: [id number]  HPI: Veronica Ortega is a 24 y.o. female  Chief Complaint  Patient presents with   Depression   Anxiety    6 month F/u   office visit    Patient stated no changes other than she went to see a dermatologist.    DEPRESSION Patient states she is doing well without medication.  She does not feel like she is having any problems with depression or anxiety.  No recent stressors.  Has seen Dermatology recently for recurent boils.  Feels like she is managing well.  Patient denies feelings of depression and hopelessness.   Denies feeling like she needs medication.  Denies SI.      03/26/2024    8:54 AM 09/22/2023    9:04 AM 04/27/2023    1:21 PM  PHQ9 SCORE ONLY  PHQ-9 Total Score 0 0  0      Data saved with a previous flowsheet row definition        03/26/2024    8:54 AM 09/22/2023    9:04 AM 04/27/2023    1:21 PM 03/21/2023    9:45 AM  GAD 7 : Generalized Anxiety Score  Nervous, Anxious, on Edge 0 0 0 0  Control/stop worrying 0 0 0 0  Worry too much - different things 0 0 0 0  Trouble relaxing 0 0 0 0  Restless 0 0 0 0  Easily annoyed or irritable 0 0 0 0  Afraid - awful might happen 0 0 0 0  Total GAD 7 Score 0 0 0 0  Anxiety Difficulty Not difficult at all Not difficult at all       Relevant past medical, surgical, family and social history reviewed and updated as indicated. Interim medical history since our last visit reviewed. Allergies and medications reviewed and updated.  Review of Systems  Psychiatric/Behavioral:  Negative for dysphoric mood and suicidal ideas. The patient is not nervous/anxious.     Per HPI unless specifically  indicated above     Objective:    BP 116/75 (BP Location: Right Arm, Patient Position: Sitting, Cuff Size: Normal)   Pulse 68   Temp 97.9 F (36.6 C) (Oral)   Ht 5' 0.51 (1.537 m)   Wt 186 lb 12.8 oz (84.7 kg)   LMP 03/26/2024 (Exact Date)   SpO2 99%   BMI 35.87 kg/m   Wt Readings from Last 3 Encounters:  03/26/24 186 lb 12.8 oz (84.7 kg)  11/17/23 192 lb 3.2 oz (87.2 kg)  10/25/23 197 lb (89.4 kg)    Physical Exam Vitals and nursing note reviewed. Exam conducted with a chaperone present Montie Edison, CMA).  Constitutional:      General: She is not in acute distress.    Appearance: Normal appearance. She is not ill-appearing, toxic-appearing or diaphoretic.  HENT:     Head: Normocephalic.     Right Ear: External ear normal.     Left Ear: External ear normal.     Nose: Nose normal.     Mouth/Throat:  Mouth: Mucous membranes are moist.     Pharynx: Oropharynx is clear.  Eyes:     General:        Right eye: No discharge.        Left eye: No discharge.     Extraocular Movements: Extraocular movements intact.     Conjunctiva/sclera: Conjunctivae normal.     Pupils: Pupils are equal, round, and reactive to light.  Cardiovascular:     Rate and Rhythm: Normal rate and regular rhythm.     Heart sounds: No murmur heard. Pulmonary:     Effort: Pulmonary effort is normal. No respiratory distress.     Breath sounds: Normal breath sounds. No wheezing or rales.  Genitourinary:  Musculoskeletal:     Cervical back: Normal range of motion and neck supple.  Skin:    General: Skin is warm and dry.     Capillary Refill: Capillary refill takes less than 2 seconds.  Neurological:     General: No focal deficit present.     Mental Status: She is alert and oriented to person, place, and time. Mental status is at baseline.  Psychiatric:        Mood and Affect: Mood normal.        Behavior: Behavior normal.        Thought Content: Thought content normal.        Judgment:  Judgment normal.     Results for orders placed or performed in visit on 11/17/23  TSH   Collection Time: 11/17/23 10:43 AM  Result Value Ref Range   TSH 2.160 0.450 - 4.500 uIU/mL  T4, free   Collection Time: 11/17/23 10:43 AM  Result Value Ref Range   Free T4 1.18 0.82 - 1.77 ng/dL      Assessment & Plan:   Problem List Items Addressed This Visit       Other   Depression, major, single episode, complete remission - Primary   Chronic. Controlled without medication.  Denies SI. Follow up in 6 months.  Call sooner if concerns arise.       Other Visit Diagnoses       Flu vaccine need       Relevant Orders   Flu vaccine trivalent PF, 6mos and older(Flulaval,Afluria,Fluarix,Fluzone) (Completed)     Need for tetanus booster       Relevant Orders   Td : Tetanus/diphtheria >7yo Preservative  free (Completed)         Follow up plan: Return in about 6 months (around 09/23/2024) for Physical and Fasting labs.      "

## 2024-03-26 NOTE — Assessment & Plan Note (Signed)
 Chronic. Controlled without medication.  Denies SI. Follow up in 6 months.  Call sooner if concerns arise.

## 2024-04-12 ENCOUNTER — Other Ambulatory Visit: Payer: Self-pay | Admitting: Nurse Practitioner

## 2024-04-13 NOTE — Telephone Encounter (Signed)
 D/C 02/28/23. Requested Prescriptions  Refused Prescriptions Disp Refills   sertraline  (ZOLOFT ) 25 MG tablet [Pharmacy Med Name: SERTRALINE  25MG  TABLETS] 30 tablet 0    Sig: TAKE 1 TABLET(25 MG) BY MOUTH DAILY     Psychiatry:  Antidepressants - SSRI - sertraline  Failed - 04/13/2024  2:17 PM      Failed - AST in normal range and within 360 days    AST  Date Value Ref Range Status  03/21/2023 19 0 - 40 IU/L Final         Failed - ALT in normal range and within 360 days    ALT  Date Value Ref Range Status  03/21/2023 20 0 - 32 IU/L Final         Passed - Completed PHQ-2 or PHQ-9 in the last 360 days      Passed - Valid encounter within last 6 months    Recent Outpatient Visits           2 weeks ago Depression, major, single episode, complete remission   [place] [facility] Melvin Pao, NP   4 months ago Hidradenitis   [place] [facility] Melvin Pao, NP   6 months ago Depression, major, single episode, complete remission   [place] [facility] Melvin Pao, NP   11 months ago Screening for cervical cancer   [place] [facility] Melvin Pao, NP       Future Appointments             In 1 month Raymund, Lauraine BROCKS, MD [facility]

## 2024-05-21 ENCOUNTER — Ambulatory Visit

## 2024-09-25 ENCOUNTER — Encounter: Admitting: Nurse Practitioner
